# Patient Record
Sex: Male | Born: 1950 | Race: White | Hispanic: No | Marital: Married
Health system: Midwestern US, Academic
[De-identification: ages and names within clinical notes are randomized; demographics above are authoritative.]

---

## 2016-08-12 MED ORDER — ROSUVASTATIN 20 MG PO TAB
10 mg | ORAL_TABLET | ORAL | 3 refills | 90.00000 days | Status: DC
Start: 2016-08-12 — End: 2017-04-18

## 2016-12-13 ENCOUNTER — Encounter: Admit: 2016-12-13 | Discharge: 2016-12-13 | Payer: MEDICARE

## 2016-12-13 DIAGNOSIS — I10 Essential (primary) hypertension: Principal | ICD-10-CM

## 2016-12-13 MED ORDER — OLMESARTAN-HYDROCHLOROTHIAZIDE 20-12.5 MG PO TAB
1 | ORAL_TABLET | Freq: Two times a day (BID) | ORAL | 3 refills | Status: AC
Start: 2016-12-13 — End: 2016-12-15

## 2016-12-15 ENCOUNTER — Encounter: Admit: 2016-12-15 | Discharge: 2016-12-15 | Payer: MEDICARE

## 2016-12-15 DIAGNOSIS — I10 Essential (primary) hypertension: Principal | ICD-10-CM

## 2016-12-15 DIAGNOSIS — D6851 Activated protein C resistance: ICD-10-CM

## 2016-12-15 MED ORDER — OLMESARTAN-HYDROCHLOROTHIAZIDE 20-12.5 MG PO TAB
1 | ORAL_TABLET | Freq: Every morning | ORAL | 3 refills | Status: AC
Start: 2016-12-15 — End: 2018-01-05

## 2017-01-02 ENCOUNTER — Encounter: Admit: 2017-01-02 | Discharge: 2017-01-02 | Payer: MEDICARE

## 2017-01-02 NOTE — Progress Notes
Records Request    Medical records request for continuation of care:    Patient has appointment on 01/17/17   with  Dr. Marissa Nestle* .    Please fax records to [facility]  [phone]    Request records:        CT Calcium Score      Thank you,      [facility]  [facility]  [address]  [place], [place] [address]  Phone:  [phone]  Fax:  [phone]

## 2017-01-17 ENCOUNTER — Ambulatory Visit: Admit: 2017-01-17 | Discharge: 2017-01-18 | Payer: MEDICARE

## 2017-01-17 ENCOUNTER — Encounter: Admit: 2017-01-17 | Discharge: 2017-01-17 | Payer: MEDICARE

## 2017-01-17 DIAGNOSIS — I10 Essential (primary) hypertension: Principal | ICD-10-CM

## 2017-01-17 DIAGNOSIS — N289 Disorder of kidney and ureter, unspecified: ICD-10-CM

## 2017-01-17 DIAGNOSIS — E785 Hyperlipidemia, unspecified: ICD-10-CM

## 2017-01-17 DIAGNOSIS — R0600 Dyspnea, unspecified: ICD-10-CM

## 2017-01-17 DIAGNOSIS — E7800 Pure hypercholesterolemia, unspecified: ICD-10-CM

## 2017-01-17 DIAGNOSIS — D6851 Activated protein C resistance: ICD-10-CM

## 2017-01-25 ENCOUNTER — Ambulatory Visit: Admit: 2017-01-25 | Discharge: 2017-01-26 | Payer: MEDICARE

## 2017-01-25 DIAGNOSIS — R0600 Dyspnea, unspecified: ICD-10-CM

## 2017-01-25 DIAGNOSIS — E785 Hyperlipidemia, unspecified: ICD-10-CM

## 2017-01-25 DIAGNOSIS — N289 Disorder of kidney and ureter, unspecified: ICD-10-CM

## 2017-01-25 DIAGNOSIS — I10 Essential (primary) hypertension: Principal | ICD-10-CM

## 2017-02-06 ENCOUNTER — Encounter: Admit: 2017-02-06 | Discharge: 2017-02-06 | Payer: MEDICARE

## 2017-02-06 NOTE — Telephone Encounter
ADDENDUM: Comment regarding results of study ordered as result of this encounter are as follows:  The Bruce protocol exercise test that I suggested to Alejandro Jensen on September 11 has been completed.  I did the interpretation of this myself after it was routed to me upon its completion September 19.  He went 6.41minutes exceeding age corrected target heart rate.  He had upsloping ST segment shifts that were not indicative of ischemia.  His blood pressure and heart rate response were appropriate.  There is nothing to suggest that he is at high risk for adverse events based on the stress test.  There is no indication for him to pursue coronary angiography or further diagnostic testing when the ST segment are as unremarkable in this treadmill only EKG stress test.  He continues to need vigorous risk factor control with LDL maintain below 100.  I do not believe there is evidence to warrant adding Zetia 10 mg daily to his course of treatment with Crestor as long as his LDL remains a substantially below 100.    I will ask 1 of my [place] nurses to contact Alejandro Jensen about these results.      Marissa Nestle, MD 02/05/2017 1:44 PM      Date of Service: 01/17/2017

## 2017-04-18 ENCOUNTER — Encounter: Admit: 2017-04-18 | Discharge: 2017-04-18 | Payer: MEDICARE

## 2017-04-18 MED ORDER — ROSUVASTATIN 20 MG PO TAB
10 mg | ORAL_TABLET | ORAL | 3 refills | 90.00000 days | Status: AC
Start: 2017-04-18 — End: 2017-12-01

## 2017-04-18 NOTE — Telephone Encounter
Dear Dr. Andreas NewportEplee,     Alejandro Jensen (DOB [birth year]) is also followed by cardiologist, Dr. Marissa Nestleharles Porter.    Please send the following records for continuity of care:    Most recent lipid and liver profile, chemistry panel, and thyroid panel.    Please fax results to:     [facility]  [place] [facility] [place] fax: [phone]               Thank you,     Alejandro LushAndrea, RN  Please call [phone] with any questions or concerns.

## 2017-05-10 ENCOUNTER — Encounter: Admit: 2017-05-10 | Discharge: 2017-05-10 | Payer: MEDICARE

## 2017-05-10 MED ORDER — AMLODIPINE 5 MG PO TAB
ORAL_TABLET | Freq: Every day | 2 refills | Status: AC
Start: 2017-05-10 — End: 2018-02-07

## 2017-06-14 ENCOUNTER — Encounter: Admit: 2017-06-14 | Discharge: 2017-06-14 | Payer: MEDICARE

## 2017-06-14 MED ORDER — CARTIA XT 180 MG PO CP24
ORAL_CAPSULE | Freq: Every day | ORAL | 3 refills | 45.00000 days | Status: AC
Start: 2017-06-14 — End: 2018-06-20

## 2017-12-01 ENCOUNTER — Encounter: Admit: 2017-12-01 | Discharge: 2017-12-01 | Payer: MEDICARE

## 2017-12-01 MED ORDER — ROSUVASTATIN 20 MG PO TAB
10 mg | ORAL_TABLET | ORAL | 3 refills | 90.00000 days | Status: AC
Start: 2017-12-01 — End: 2018-01-16

## 2017-12-05 ENCOUNTER — Encounter: Admit: 2017-12-05 | Discharge: 2017-12-05 | Payer: MEDICARE

## 2017-12-08 ENCOUNTER — Encounter: Admit: 2017-12-08 | Discharge: 2017-12-08 | Payer: MEDICARE

## 2017-12-08 LAB — CBC
Lab: 12
Lab: 28 — ABNORMAL HIGH (ref 80–115)
Lab: 31 — ABNORMAL LOW (ref 33.0–37.0)
Lab: 54
Lab: 17
Lab: 296 — ABNORMAL LOW (ref >59)
Lab: 6.1
Lab: 6.1 — ABNORMAL HIGH (ref 4.7–6.1)
Lab: 87 — ABNORMAL HIGH (ref 0.72–1.25)

## 2017-12-08 LAB — BASIC METABOLIC PANEL: Lab: 141

## 2017-12-08 LAB — LIPID PROFILE: Lab: 137 — ABNORMAL LOW (ref 150–200)

## 2018-01-05 ENCOUNTER — Encounter: Admit: 2018-01-05 | Discharge: 2018-01-05 | Payer: MEDICARE

## 2018-01-05 DIAGNOSIS — I10 Essential (primary) hypertension: Principal | ICD-10-CM

## 2018-01-05 MED ORDER — OLMESARTAN-HYDROCHLOROTHIAZIDE 20-12.5 MG PO TAB
1 | ORAL_TABLET | Freq: Every morning | ORAL | 3 refills | Status: AC
Start: 2018-01-05 — End: 2018-11-22

## 2018-01-12 ENCOUNTER — Encounter: Admit: 2018-01-12 | Discharge: 2018-01-12 | Payer: MEDICARE

## 2018-01-12 DIAGNOSIS — N289 Disorder of kidney and ureter, unspecified: Principal | ICD-10-CM

## 2018-01-12 DIAGNOSIS — I10 Essential (primary) hypertension: ICD-10-CM

## 2018-01-15 LAB — BASIC METABOLIC PANEL
Lab: 1.7 — ABNORMAL HIGH (ref 0.72–1.25)
Lab: 105
Lab: 117 — ABNORMAL HIGH (ref 80–115)
Lab: 14 MMOL/L (ref 137–147)
Lab: 140
Lab: 19
Lab: 25
Lab: 4.1
Lab: 9.2

## 2018-01-16 ENCOUNTER — Ambulatory Visit: Admit: 2018-01-16 | Discharge: 2018-01-17 | Payer: MEDICARE

## 2018-01-16 ENCOUNTER — Encounter: Admit: 2018-01-16 | Discharge: 2018-01-16 | Payer: MEDICARE

## 2018-01-16 DIAGNOSIS — I10 Essential (primary) hypertension: ICD-10-CM

## 2018-01-16 DIAGNOSIS — N289 Disorder of kidney and ureter, unspecified: Principal | ICD-10-CM

## 2018-01-16 DIAGNOSIS — E7800 Pure hypercholesterolemia, unspecified: ICD-10-CM

## 2018-01-16 DIAGNOSIS — D6851 Activated protein C resistance: ICD-10-CM

## 2018-01-16 DIAGNOSIS — I700 Atherosclerosis of aorta: Principal | ICD-10-CM

## 2018-01-16 MED ORDER — ROSUVASTATIN 10 MG PO TAB
10 mg | ORAL_TABLET | Freq: Every day | ORAL | 3 refills | 90.00000 days | Status: AC
Start: 2018-01-16 — End: 2018-03-19

## 2018-02-07 ENCOUNTER — Encounter: Admit: 2018-02-07 | Discharge: 2018-02-07 | Payer: MEDICARE

## 2018-02-07 MED ORDER — AMLODIPINE 5 MG PO TAB
ORAL_TABLET | Freq: Every day | 2 refills | Status: AC
Start: 2018-02-07 — End: 2018-10-29

## 2018-03-17 ENCOUNTER — Encounter: Admit: 2018-03-17 | Discharge: 2018-03-17 | Payer: MEDICARE

## 2018-03-19 ENCOUNTER — Encounter: Admit: 2018-03-19 | Discharge: 2018-03-19 | Payer: MEDICARE

## 2018-03-19 MED ORDER — ROSUVASTATIN 10 MG PO TAB
10 mg | ORAL_TABLET | Freq: Every day | ORAL | 3 refills | 90.00000 days | Status: AC
Start: 2018-03-19 — End: 2019-02-11

## 2018-03-19 MED ORDER — ROSUVASTATIN 10 MG PO TAB
ORAL_TABLET | ORAL | 3 refills | 90.00000 days | Status: AC
Start: 2018-03-19 — End: 2018-03-19

## 2018-04-30 ENCOUNTER — Encounter: Admit: 2018-04-30 | Discharge: 2018-04-30 | Payer: MEDICARE

## 2018-04-30 DIAGNOSIS — E7800 Pure hypercholesterolemia, unspecified: Principal | ICD-10-CM

## 2018-06-04 ENCOUNTER — Encounter: Admit: 2018-06-04 | Discharge: 2018-06-04 | Payer: MEDICARE

## 2018-06-05 ENCOUNTER — Encounter: Admit: 2018-06-05 | Discharge: 2018-06-05 | Payer: MEDICARE

## 2018-06-05 ENCOUNTER — Ambulatory Visit: Admit: 2018-06-05 | Discharge: 2018-06-05 | Payer: MEDICARE

## 2018-06-05 DIAGNOSIS — D6851 Activated protein C resistance: Secondary | ICD-10-CM

## 2018-06-05 DIAGNOSIS — I10 Essential (primary) hypertension: Secondary | ICD-10-CM

## 2018-06-05 DIAGNOSIS — N132 Hydronephrosis with renal and ureteral calculous obstruction: Secondary | ICD-10-CM

## 2018-06-05 DIAGNOSIS — E7800 Pure hypercholesterolemia, unspecified: Secondary | ICD-10-CM

## 2018-06-05 DIAGNOSIS — N183 Chronic kidney disease, stage 3 (moderate): Secondary | ICD-10-CM

## 2018-06-05 DIAGNOSIS — N289 Disorder of kidney and ureter, unspecified: Secondary | ICD-10-CM

## 2018-06-05 DIAGNOSIS — I700 Atherosclerosis of aorta: Secondary | ICD-10-CM

## 2018-06-05 LAB — BASIC METABOLIC PANEL
Lab: 1.8 mg/dL — ABNORMAL HIGH (ref 0.4–1.24)
Lab: 139 MMOL/L (ref 137–147)
Lab: 25 mg/dL (ref 7–25)
Lab: 3.6 MMOL/L (ref 3.5–5.1)
Lab: 36 mL/min — ABNORMAL LOW (ref >60)

## 2018-06-05 LAB — MICROALB/CR RATIO-URINE RANDOM
Lab: 11 ug/mg (ref <30)
Lab: 165 mg/dL (ref 7–40)
Lab: 19 ug/mL — ABNORMAL HIGH (ref <19)

## 2018-06-05 LAB — 25-OH VITAMIN D (D2 + D3): Lab: 51 ng/mL (ref 30–80)

## 2018-06-05 LAB — CBC
Lab: 13 % (ref 11–15)
Lab: 16 g/dL (ref 13.5–16.5)
Lab: 29 pg (ref 26–34)
Lab: 47 % (ref 40–50)
Lab: 5 10*3/uL (ref 4.5–11.0)
Lab: 5.5 M/UL — ABNORMAL HIGH (ref 4.4–5.5)
Lab: 8.1 FL (ref 7–11)

## 2018-06-05 LAB — LIPID PROFILE
Lab: 191 mg/dL — ABNORMAL HIGH (ref <150)
Lab: 26 mg/dL — ABNORMAL LOW (ref >40)
Lab: 39 mg/dL (ref <100)
Lab: 88 mg/dL (ref <200)

## 2018-06-05 LAB — PROTEIN/CR RATIO,UR RAN: Lab: 0.2 (ref 3–12)

## 2018-06-05 LAB — PHOSPHORUS: Lab: 3.4 mg/dL — ABNORMAL LOW (ref >60)

## 2018-06-05 LAB — URIC ACID: Lab: 10 mg/dL — ABNORMAL HIGH (ref 4.0–8.0)

## 2018-06-05 LAB — MAGNESIUM: Lab: 2.2 mg/dL (ref 1.6–2.6)

## 2018-06-05 LAB — CREATINE KINASE-CPK: Lab: 110 U/L (ref 35–232)

## 2018-06-20 ENCOUNTER — Encounter: Admit: 2018-06-20 | Discharge: 2018-06-20 | Payer: MEDICARE

## 2018-06-20 MED ORDER — CARTIA XT 180 MG PO CP24
ORAL_CAPSULE | ORAL | 3 refills | 45.00000 days | Status: AC
Start: 2018-06-20 — End: 2019-06-21

## 2018-07-03 ENCOUNTER — Ambulatory Visit: Admit: 2018-07-03 | Discharge: 2018-07-03 | Payer: MEDICARE

## 2018-07-03 ENCOUNTER — Encounter: Admit: 2018-07-03 | Discharge: 2018-07-03 | Payer: MEDICARE

## 2018-07-03 DIAGNOSIS — E790 Hyperuricemia without signs of inflammatory arthritis and tophaceous disease: ICD-10-CM

## 2018-07-03 DIAGNOSIS — E7800 Pure hypercholesterolemia, unspecified: ICD-10-CM

## 2018-07-03 DIAGNOSIS — I10 Essential (primary) hypertension: ICD-10-CM

## 2018-07-03 DIAGNOSIS — D6851 Activated protein C resistance: ICD-10-CM

## 2018-07-03 DIAGNOSIS — N183 Chronic kidney disease, stage 3 (moderate): ICD-10-CM

## 2018-07-03 NOTE — Progress Notes
When he checks his blood pressures in the 120/70 range.  Today's range in the 130/76 is not unexpected.  Not having any readings in the 1 40-1 50 range.    In January 2018 and I had him undergo a coronary calcium score scan at [facility] which gave a  total Agatston score of 454.4 which put him in a higher risk category compared to lower scores    He had a treadmill exercise test on January 25, 2017 which showed the following: Bruce protocol exercise test  ended at 6 min 45 secs, reaching 88% of age-predicted maximum heart rate..  He had upsloping ST segment shifts that were not indicative of ischemia.  His blood pressure and heart rate response were appropriate.     He had a flulike syndrome couple weeks ago.  He did not go in for respiratory virus swab he just stayed home and recovered uneventfully.           Vitals:    07/03/18 1113 07/03/18 1116   BP: 132/78 134/76   BP Source: Arm, Right Upper Arm, Left Upper   Pulse: 81    SpO2: 98%    Weight: 97.1 kg (214 lb)    Height: 1.753 m (5' 9)    PainSc: Zero      Body mass index is 31.6 kg/m???.     Past Medical History  Patient Active Problem List    Diagnosis Date Noted   ??? Chronic kidney disease, stage 3 (HCC) 06/05/2018   ??? Renal insufficiency 08/14/2012     Associated with hydronephrosis and angiotensin receptor blocker therapy     ??? Hypothyroidism 07/31/2012     07/30/12 TSH 4.42 on 125 mcg synthroid,(Nl < 3.74) increase to 150 Dr. Andreas Newport.      ??? Hydronephrosis with right ureteral calculus 03/21/2012     11/13 Stone removed d/t persistent pain [facility]  Jan 2014 Creat 1.8, 07/30/12 Creat 1.6 BUN 17, K 4.3 on Losartan 100/day w/ blood pressure 152/82   1/15 OV Dr. Larwance Rote, most recent Creat 1.8     ??? Essential hypertension      a. Valsartan better control than accupril  b. 2008 Echo EF 60% Septum 1.8 cm, post wall 1.4 indicating LVH as end organ damage from LVH  c. 2/09 Echo EF 60% Septum and Post wall 1.4 cm D. 07/31/12 BP 152/82 on Losartan 100   E. 06/18/13 BP 110/80 MAC office on Amlodipine 5, Dilt 360, Valsartan 160 andHCTZ 12.5 with no edema       ??? Hypercholesterolemia       a. Niacin from 2006(?)    b. 04/28/06 total 169 trig 219 HDL 39 LDLd 85 Niacin 500 BID    c. 07/17/08 Total 97 trig 79 HDL 40 LDL 42 Crestor 20 niacin 500 BID  d. 02/17/15 DC niacin 500 d/t lack of outcomes benefits from Niacin.        ??? Factor V Leiden (HCC)      a. Folate Rx by [facility], DC'd 3/08             Review of Systems   Constitution: Negative.   HENT: Negative.    Eyes: Negative.    Cardiovascular: Negative.    Respiratory: Negative.    Endocrine: Negative.    Hematologic/Lymphatic: Negative.    Skin: Negative.    Musculoskeletal: Negative.    Gastrointestinal: Positive for abdominal pain.   Genitourinary: Negative.    Neurological: Negative.  Psychiatric/Behavioral: Negative.    Allergic/Immunologic: Negative.    14 organ system review noted. It is negative except as reported in current narrative or above in the ROS section. This is a patient centered review of systems that was stated by the patient in his terms prior to my personal problem oriented interview with the patient     Physical Exam  General: Patient in no distress, looks generally healthy. Skin warm and dry, non icteric,     Mucous membranes moist  Pupils equal and round  Carotids: no bruits    Thyroid not enlarged.  Neck veins: CVP <6 normal, no V wave, no HJR     Respiratoryt: Breathing comfortably. Lungs clear to percussion & auscultation .No rales, rhonchi or wheezing   Cardiac: Regular rhythm. LV impulse not palpable. Normal S1 & S2, Fourth heart sound, no rub or S3. No murmur  Abdomen soft, non-tender, no masses or bruits, No hepatic or aortic enlargement. + bowel sounds.   Femoral arteries: Good pulses, no bruits.  Legs/feet: Normal PT pulses, No edema,  Motor: Normal muscle strength.      Cognitive: Good insight. No depression    Cardiovascular Studies Today's 12 lead EKG: sinus rhythm, rate 68.     Her recent lipid profile which I think was drawn when he saw Dr. Melida Quitter at [facility] shows HDL at 26 and LDL slightly under 40 at 39 with a substantial drop of 50% since January 2018.   05/16/2016 09/12/2017  06/05/2018    Cholesterol 142 (L) 137 (L) 88   Triglycerides 171 124 191 (H)   HDL 39 36 26 (L)   LDL 78 72 39     Problems Addressed Today  Encounter Diagnoses   Name Primary?   ??? Hypercholesterolemia Yes   ??? Essential hypertension    ??? Hydronephrosis with right ureteral calculus        Assessment and Plan     Mr. Alejandro Jensen had a benign treadmill exercise test less than 18 months ago.  He has sustained satisfactory exercise program predominantly using his weights been doing some walking.  I do not think he needs further stress testing with without onset of symptoms.    His controlled hypertension is difficult to assess.  He is not had an echocardiogram in years.  I think getting an echo would be prudent because it would give [place] an idea about LV hypertrophy and thus an indication of whether his usual blood pressure control over the past several months has been satisfactory.  Hopefully he will have wall thickness 1.0 cm  or less but that remains to be seen.  Would not necessarily make a major augmentation of his blood pressure readings if he does have concentric LV hypertrophy but I would consider making some augmentation of treatment.  Dr. Melida Quitter noted when she saw him in January that the hydrochlorothiazide he is taking could contribute to hyperuricemia and worsened renal function related to the uric acid crystals.    I think it is important at this time to get the echo to see if he has LV hypertrophy.  He does not need a Doppler just an echo as there is no suggestion of significant valve disease or pulmonary hypertension.  I like him to get this done in our office lab where I can review it.    Note his uric acid was elevated at 10.6 when it was checked when he saw Dr. Melida Quitter.  I think when we have profile of his wall  thickness is an indicator of adequacy of high blood pressure control and knowledge about his elevated uric acid Dr. Melida Quitter and I may be able to drive program directed at optimal blood pressure control and minimization of risk of uric acid related nephrotoxicity.    Marland Kitcheniiii   Patient Instructions   Your blood pressure control looks good today in the office barely above 130.  I mention to you that when you saw Dr. Melida Quitter you had a slightly higher blood pressure on the right side and today it slightly higher on the left side.  That means there is no obstruction between the arms and that you do not need to be evaluated for any kind of blood vessel abnormality blocking blood flow to one arm or the other was the kidneys.    Like you get an echocardiogram to see if you have thick walls of the heart which would indicate your blood pressure control is not optimal.    Was here uric acid elevation at 10.6 detected when you had lab work with Dr. Melida Quitter in January we need to consider whether revision of your blood pressure medications needs to include reduction or elimination of the hydrochlorothiazide which can raise the uric acid.  Is more complicated for [place] to change her blood pressure meds if we see you have LV hypertrophy which means we need to be more vigorous with your blood pressure controlled and we are now.    Your stress test looked good a year and a half ago.  I would like you to get your exercise time up to 3 hours a week with more stamina exercise as well as the weights.  As long as you are not having chest pain or new shortness of breath I do not think you will need a stress test in the near future.    Your cholesterol control looks good.  No change there.    .  I will do my best to  reveiew your 2 D echo only, (no Doppler) for wall thickness soon after it is done but if you do not hear anything in a couple of weeks call back in to [place] or Misty Stanley to remind me to take a look    It's good to see you today.   I think it would be good for you to return for a recheck in six months. I can see you sooner if needed.   Call in if you have problems or questions.   Marissa Nestle, MD                     Current Medications (including today's revisions)  ??? amLODIPine (NORVASC) 5 mg tablet TAKE 1 TABLET BY MOUTH EVERY DAY   ??? Aspirin 81 mg Tab Take 1 tablet by mouth daily.   ??? CARTIA XT 180 mg capsule 2C PO QD   ??? Coenzyme Q10 (CO Q-10) 10 mg cap Take  by mouth.   ??? ERGOCALCIFEROL (VITAMIN D2) (VITAMIN D PO) Take 2,000 Units by mouth daily.   ??? levothyroxine (SYNTHROID) 125 mcg tablet Take 125 mcg by mouth daily.   ??? olmesartan-hydrochlorothiazide (BENICAR HCT) 20-12.5 mg tablet Take one tablet by mouth every morning.   ??? rosuvastatin (CRESTOR) 10 mg tablet Take one tablet by mouth daily.   ??? traMADoL (ULTRAM) 50 mg tablet Take 50 mg by mouth every 6 hours as needed for Pain.

## 2018-07-11 ENCOUNTER — Ambulatory Visit: Admit: 2018-07-11 | Discharge: 2018-07-12 | Payer: MEDICARE

## 2018-07-11 DIAGNOSIS — I10 Essential (primary) hypertension: ICD-10-CM

## 2018-07-11 DIAGNOSIS — E7800 Pure hypercholesterolemia, unspecified: Principal | ICD-10-CM

## 2018-07-11 MED ORDER — PERFLUTREN LIPID MICROSPHERES 1.1 MG/ML IV SUSP
1-20 mL | Freq: Once | INTRAVENOUS | 0 refills | Status: CP | PRN
Start: 2018-07-11 — End: ?

## 2018-10-03 ENCOUNTER — Encounter: Admit: 2018-10-03 | Discharge: 2018-10-03 | Payer: MEDICARE

## 2018-10-03 DIAGNOSIS — N200 Calculus of kidney: Principal | ICD-10-CM

## 2018-10-29 ENCOUNTER — Encounter: Admit: 2018-10-29 | Discharge: 2018-10-29

## 2018-10-29 MED ORDER — AMLODIPINE 5 MG PO TAB
ORAL_TABLET | Freq: Every day | 1 refills | Status: DC
Start: 2018-10-29 — End: 2019-01-25

## 2018-11-21 ENCOUNTER — Encounter: Admit: 2018-11-21 | Discharge: 2018-11-21

## 2018-11-21 DIAGNOSIS — I10 Essential (primary) hypertension: Secondary | ICD-10-CM

## 2018-11-22 MED ORDER — OLMESARTAN-HYDROCHLOROTHIAZIDE 20-12.5 MG PO TAB
ORAL_TABLET | Freq: Every morning | ORAL | 3 refills | Status: DC
Start: 2018-11-22 — End: 2018-12-31

## 2018-12-31 ENCOUNTER — Encounter: Admit: 2018-12-31 | Discharge: 2018-12-31

## 2018-12-31 DIAGNOSIS — N183 Chronic kidney disease, stage 3 (moderate): Secondary | ICD-10-CM

## 2018-12-31 DIAGNOSIS — D508 Other iron deficiency anemias: Secondary | ICD-10-CM

## 2018-12-31 DIAGNOSIS — E790 Hyperuricemia without signs of inflammatory arthritis and tophaceous disease: Secondary | ICD-10-CM

## 2018-12-31 DIAGNOSIS — I10 Essential (primary) hypertension: Secondary | ICD-10-CM

## 2018-12-31 MED ORDER — OLMESARTAN-HYDROCHLOROTHIAZIDE 20-12.5 MG PO TAB
ORAL_TABLET | Freq: Every morning | ORAL | 3 refills | Status: DC
Start: 2018-12-31 — End: 2019-11-14

## 2019-01-01 ENCOUNTER — Encounter: Admit: 2019-01-01 | Discharge: 2019-01-01

## 2019-01-01 ENCOUNTER — Ambulatory Visit: Admit: 2019-01-01 | Discharge: 2019-01-02

## 2019-01-01 DIAGNOSIS — E7800 Pure hypercholesterolemia, unspecified: Secondary | ICD-10-CM

## 2019-01-01 DIAGNOSIS — D6851 Activated protein C resistance: Secondary | ICD-10-CM

## 2019-01-01 DIAGNOSIS — I10 Essential (primary) hypertension: Secondary | ICD-10-CM

## 2019-01-01 DIAGNOSIS — N183 Chronic kidney disease, stage 3 (moderate): Principal | ICD-10-CM

## 2019-01-01 NOTE — Research Notes
Study Title: EMPA-kidney  HSC/IRB Number: [id number]  Consent Version Date: 08/03/2018  Consent Type:Screen    Clinical trial participation and research nature of the trial were discussed with subject during this visit. Subject was alert and oriented during consent discussion. Subject was informed that clinical trial is voluntary and participant may withdraw consent at any time for any reason by notifying study team. Study purpose, procedures, tests, samples to be obtained, potential side effects, benefits, foreseeable risks and duration of study were discussed. HIPAA information, compensation were discussed per consent form. Subject verbalized understanding.     Subject was given time to review the consent form and to discuss participation in this study. Questions asked were answered to participants satisfaction and subject voiced desire to sign consent today. Subject signed consent without coercion and undue influence. A copy of the signed consent was given to the subject and emailed [facility] [employer] [facility] ([employer]) for scanning into the subjects medical record in Epic O2. Contact information for the study team was given to the subject.     No research specific procedures took place prior to consenting.    Please contact Dr Reem Mustafa M.D. [phone] or   Research Nurse Study Coordinator  [phone]

## 2019-01-01 NOTE — Patient Instructions
Call urologist about BPH meds.

## 2019-01-01 NOTE — Progress Notes
Date of Service: 01/01/2019    Subjective:             Alejandro Jensen is a 68 y.o. male with HTN, CKD. Last (and first) seen by me 06/05/18.     History of Present Illness  No hosp, sig illness since I last saw pt.     Homes BPs 120-130/70-80    Sees outside [facility] annually for yrs.     Excerpt from my initial note 06/05/18:  Pt has known of elevated Cr X many years. Pt doesn't know etiol. Thinks it might be kidney stones. Was seen by neph [facility] once. Has not been told of prot, hemat.  No hx prolonged, heavy use of NSAID or aceta???  Has passed 2 kidney. First time pt was in [place] 25 yrs ago. Was seen in ER. Next stone 10-15 yrs later. Was seen by urol at that time. Had stent. No stones/gravel since then. Pt doesn't think he had 24 urine, etc. ???  Had n/vom/d sev days ago, lasted 3-4 days. Completely resolved X 1-2 days???  HTN X 30 yr???  Review of O2:  07/04/17 echo--mod LVH???  Cr mid 1.0s since Feb 2012 at least; Cr-1.7-1.8 since 2014.  Cr-1.7 on 01/15/18   Electrolytes unremarkable???  No labs in CE.     Review of Systems   Constitutional: Negative.    HENT: Negative.    Eyes: Negative.    Respiratory: Negative.    Cardiovascular: Negative.    Gastrointestinal: Negative.    Endocrine: Negative.    Genitourinary: Negative.         Noct X 1-2, no change X months, was always once before. Urinates sitting d/t hesitancy; some difficulty emptying bladder; no dribbling. No recent stones.   Musculoskeletal: Negative.    Skin: Negative.    Neurological: Negative.    Hematological: Negative.    Psychiatric/Behavioral: Negative.        Medical History:   Diagnosis Date   ??? Factor V Leiden (HCC) 08/20/2009   ??? Hypercholesterolemia 08/20/2009   ??? Hypertension 08/20/2009         Took meds except rosuva this am.   Objective:         ??? amLODIPine (NORVASC) 5 mg tablet TAKE 1 TABLET BY MOUTH EVERY DAY   ??? Aspirin 81 mg Tab Take 1 tablet by mouth daily.   ??? CARTIA XT 180 mg capsule 2C PO QD ??? Coenzyme Q10 (CO Q-10) 10 mg cap Take  by mouth.   ??? ERGOCALCIFEROL (VITAMIN D2) (VITAMIN D PO) Take 2,000 Units by mouth daily.   ??? levothyroxine (SYNTHROID) 125 mcg tablet Take 125 mcg by mouth daily.   ??? olmesartan-hydrochlorothiazide (BENICAR HCT) 20-12.5 mg tablet TAKE 1 TABLET BY MOUTH EVERY MORNING   ??? rosuvastatin (CRESTOR) 10 mg tablet Take one tablet by mouth daily.   ??? traMADoL (ULTRAM) 50 mg tablet Take 50 mg by mouth every 6 hours as needed for Pain.     Vitals:    01/01/19 0954 01/01/19 0959   BP: 117/64 100/71   BP Source: Arm, Right Upper Arm, Right Upper   Patient Position: Sitting Standing   Pulse: 62 76   Resp: 18    Temp: 36.7 ???C (98 ???F)    TempSrc: Oral    SpO2: 99%    Weight: 94.1 kg (207 lb 8 oz)    Height: 175.3 cm (69.02)    PainSc: Zero      Body mass index is  30.63 kg/m???.     Physical Exam  Vitals signs and nursing note reviewed.   Constitutional:       General: He is not in acute distress.     Appearance: Normal appearance. He is obese. He is not ill-appearing, toxic-appearing or diaphoretic.      Comments: Electronic, reg (previous values taken with large cuff)  Rt sitting 127/80, 75  Left sitting 133/76, 71   HENT:      Head: Normocephalic and atraumatic.      Nose: No congestion or rhinorrhea.      Mouth/Throat:      Mouth: Mucous membranes are moist.   Eyes:      General: No scleral icterus.        Right eye: No discharge.         Left eye: No discharge.      Conjunctiva/sclera: Conjunctivae normal.   Neck:      Musculoskeletal: Neck supple. No neck rigidity or muscular tenderness.   Cardiovascular:      Rate and Rhythm: Normal rate and regular rhythm.      Heart sounds: Normal heart sounds. No murmur. No friction rub. No gallop.    Pulmonary:      Effort: Pulmonary effort is normal. No respiratory distress.      Breath sounds: Normal breath sounds. No stridor. No wheezing, rhonchi or rales.   Abdominal:      General: Bowel sounds are normal. There is no distension. Palpations: Abdomen is soft. There is no mass.      Tenderness: There is no abdominal tenderness. There is no guarding or rebound.   Musculoskeletal:         General: No swelling, tenderness, deformity or signs of injury.      Right lower leg: No edema.      Left lower leg: No edema.   Lymphadenopathy:      Cervical: No cervical adenopathy.   Skin:     General: Skin is warm and dry.      Coloration: Skin is not jaundiced or pale.      Findings: No bruising, erythema, lesion or rash.   Neurological:      Mental Status: He is alert. Mental status is at baseline.      Motor: No weakness.      Coordination: Coordination normal.      Comments: Got on/off table without diff   Psychiatric:         Mood and Affect: Mood normal.         Behavior: Behavior normal.         Thought Content: Thought content normal.         Judgment: Judgment normal.         No labs in O2 or CE since Jan 2020     Assessment and Plan:  CKD3--pt has had elevated Cr for many yrs. Cr is very little changed for the past several years. Cardiol note of 2008 describes RUS that did not show evidence of RAS, though these results not available to me. There is also mention in cardiol clinic notes that Cr did not improve sig even when ARB held. I suspect his CKD is d/t HTN. His sediment was bland in Jan 2020; no bld, prot on dipstick.                --importance of good control of BP, BG, attaining/maintaining healthy wt, avoidance of nephrotoxins d/w pt at length               --  get outside neph records  ???  HTN--suspect essential. BP differs b/w arms in past, though not today. It will be important to further document if this occurs chronically. Will need to adjust meds based on higher values, if this finding continues. May need vasc surg eval if he cont to exhibit this discrepancy. BP appears to be reasonably well-controlled on current meds. I favor use of RAAS blockade in this pt.                --pt advised to have BP checked on both [bare] arms with properly fitting cuff for the next several MD appts  ???  ARB--pt cont to take all meds in setting of n/vom/d for sev days. I advised him to hold RAAS blocking agent in setting of n/vom/d, poor po intake, notify PMD and/or cardiol immed about holding, restarting once po intake resumes.   ???  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 I do not have access to previous values, though this was undoubtedly checked in past when he had kidney stone.                --follow               --could consider replacing HTCZ, rechecking uric acid to see what if any effect this med was having  ???  Nephrolithiasis--none recently.                --get outside labs, notes  ???  Possible dx/px/tx options d/w pt at length; all questions answered. RTC 6 mo

## 2019-01-02 DIAGNOSIS — N132 Hydronephrosis with renal and ureteral calculous obstruction: Secondary | ICD-10-CM

## 2019-01-02 DIAGNOSIS — I10 Essential (primary) hypertension: Secondary | ICD-10-CM

## 2019-01-25 ENCOUNTER — Encounter: Admit: 2019-01-25 | Discharge: 2019-01-25 | Payer: MEDICARE

## 2019-01-25 MED ORDER — AMLODIPINE 5 MG PO TAB
ORAL_TABLET | Freq: Every day | 3 refills | Status: AC
Start: 2019-01-25 — End: ?

## 2019-02-11 MED ORDER — ROSUVASTATIN 10 MG PO TAB
ORAL_TABLET | Freq: Every day | ORAL | 1 refills | 90.00000 days | Status: DC
Start: 2019-02-11 — End: 2019-08-13

## 2019-03-04 ENCOUNTER — Encounter: Admit: 2019-03-04 | Discharge: 2019-03-04 | Payer: MEDICARE

## 2019-03-05 ENCOUNTER — Encounter: Admit: 2019-03-05 | Discharge: 2019-03-05 | Payer: MEDICARE

## 2019-03-05 DIAGNOSIS — I10 Essential (primary) hypertension: Secondary | ICD-10-CM

## 2019-03-05 DIAGNOSIS — E7800 Pure hypercholesterolemia, unspecified: Secondary | ICD-10-CM

## 2019-03-05 DIAGNOSIS — D6851 Activated protein C resistance: Secondary | ICD-10-CM

## 2019-03-05 NOTE — Patient Instructions
Overall I am very pleased with your situation.  You are in one of the most forward looking trials in protecting kidney function that is available with the Empakidney trial.  That drug is also protective of patients with heart disease.  Although we have never seen active heart disease and you really think this drug is a breakthrough for people with heart disease or heart disease with kidney disease.  The trial you are in is important and will have a lot of impact 1 where another when it is published.    Outside the trial we need to keep track of your blood work.  When I have you check a BMP and magnesium today to make sure your basic electrolytes look good and your kidney function is about where we think it is.    Your blood pressure looks good.  Your right and left arm blood pressures are within 2 points of each other which indicates that you do not have any physical obstruction and the blood vessels between the the arms.    I will think we need to do any other work right now.  I do not think you need a stress test.  Stay on your meds.  Losing 20 pounds would be very reasonable way to lose a little weight related blood pressure.  Most men less than 6 feet 4 are healthier if their weight is under 200 rather than over.    See me in May or June.   Call in if you have problems or questions.   Lenoard Aden, MD

## 2019-06-21 ENCOUNTER — Encounter: Admit: 2019-06-21 | Discharge: 2019-06-21 | Payer: MEDICARE

## 2019-06-21 MED ORDER — DILTIAZEM HCL 180 MG PO CP24
360 mg | ORAL_CAPSULE | Freq: Every day | ORAL | 3 refills | 45.00000 days | Status: AC
Start: 2019-06-21 — End: ?

## 2019-08-13 ENCOUNTER — Encounter: Admit: 2019-08-13 | Discharge: 2019-08-13 | Payer: MEDICARE

## 2019-08-13 MED ORDER — ROSUVASTATIN 10 MG PO TAB
ORAL_TABLET | Freq: Every day | ORAL | 3 refills | 90.00000 days | Status: AC
Start: 2019-08-13 — End: ?

## 2019-08-27 NOTE — Progress Notes
pt returned call. Alejandro Jensen had already spoke to somebody from the office earlier (no documentation). I told him I would make sure orders were sent to [facility] lab. Orders faxed to [phone]

## 2019-10-22 ENCOUNTER — Encounter: Admit: 2019-10-22 | Discharge: 2019-10-22 | Payer: MEDICARE

## 2019-10-22 ENCOUNTER — Ambulatory Visit: Admit: 2019-10-22 | Discharge: 2019-10-23 | Payer: MEDICARE

## 2019-10-22 DIAGNOSIS — N183 Stage 3 chronic kidney disease, unspecified whether stage 3a or 3b CKD (HCC): Principal | ICD-10-CM

## 2019-10-22 DIAGNOSIS — E7800 Pure hypercholesterolemia, unspecified: Secondary | ICD-10-CM

## 2019-10-22 DIAGNOSIS — D6851 Activated protein C resistance: Secondary | ICD-10-CM

## 2019-10-22 DIAGNOSIS — I10 Essential (primary) hypertension: Secondary | ICD-10-CM

## 2019-11-14 ENCOUNTER — Encounter: Admit: 2019-11-14 | Discharge: 2019-11-14 | Payer: MEDICARE

## 2019-11-14 DIAGNOSIS — I10 Essential (primary) hypertension: Secondary | ICD-10-CM

## 2019-11-14 MED ORDER — OLMESARTAN-HYDROCHLOROTHIAZIDE 20-12.5 MG PO TAB
ORAL_TABLET | Freq: Every morning | ORAL | 3 refills | Status: AC
Start: 2019-11-14 — End: ?

## 2020-02-20 ENCOUNTER — Encounter: Admit: 2020-02-20 | Discharge: 2020-02-20 | Payer: MEDICARE

## 2020-02-20 MED ORDER — AMLODIPINE 5 MG PO TAB
ORAL_TABLET | Freq: Every day | 3 refills | Status: AC
Start: 2020-02-20 — End: ?

## 2020-05-06 ENCOUNTER — Encounter: Admit: 2020-05-06 | Discharge: 2020-05-06 | Payer: MEDICARE

## 2020-05-06 MED ORDER — DILTIAZEM HCL 180 MG PO CP24
ORAL_CAPSULE | Freq: Every day | 3 refills | 45.00000 days | Status: AC
Start: 2020-05-06 — End: ?

## 2020-08-17 ENCOUNTER — Encounter: Admit: 2020-08-17 | Discharge: 2020-08-17 | Payer: MEDICARE

## 2020-08-17 MED ORDER — ROSUVASTATIN 10 MG PO TAB
ORAL_TABLET | Freq: Every day | ORAL | 3 refills | 90.00000 days | Status: AC
Start: 2020-08-17 — End: ?

## 2020-10-14 ENCOUNTER — Encounter: Admit: 2020-10-14 | Discharge: 2020-10-14 | Payer: MEDICARE

## 2020-10-20 ENCOUNTER — Encounter: Admit: 2020-10-20 | Discharge: 2020-10-20 | Payer: MEDICARE

## 2020-10-20 ENCOUNTER — Ambulatory Visit: Admit: 2020-10-20 | Discharge: 2020-10-21 | Payer: MEDICARE

## 2020-10-20 DIAGNOSIS — Z006 Encounter for examination for normal comparison and control in clinical research program: Secondary | ICD-10-CM

## 2020-10-20 DIAGNOSIS — D6851 Activated protein C resistance: Secondary | ICD-10-CM

## 2020-10-20 DIAGNOSIS — E790 Hyperuricemia without signs of inflammatory arthritis and tophaceous disease: Secondary | ICD-10-CM

## 2020-10-20 DIAGNOSIS — I10 Essential (primary) hypertension: Secondary | ICD-10-CM

## 2020-10-20 DIAGNOSIS — E7800 Pure hypercholesterolemia, unspecified: Secondary | ICD-10-CM

## 2020-10-20 LAB — MICROALB/CR RATIO-URINE RANDOM
MICROALBUMIN, RAN: 61 ug/mL
MICROALBUMIN/CR RATIO URINE: 60 ug/mg — ABNORMAL HIGH (ref <30)
UR CREATININE, RAN: 101 mg/dL — ABNORMAL HIGH (ref <0.15)

## 2020-10-20 LAB — PROTEIN/CR RATIO,UR RAN: UR TOTAL PROTEIN,RAN: 21 mg/dL

## 2020-10-20 NOTE — Patient Instructions
[place] [phone]

## 2020-10-21 DIAGNOSIS — N1832 Chronic kidney disease, stage 3b (HCC): Secondary | ICD-10-CM

## 2020-10-21 DIAGNOSIS — N183 Chronic kidney disease, stage 3 unspecified (HCC): Secondary | ICD-10-CM

## 2020-10-23 IMAGING — CR PELVIS
1 series · 1 of 1 positions shown · non-contrast
Comparison: none

[l-spine ap]
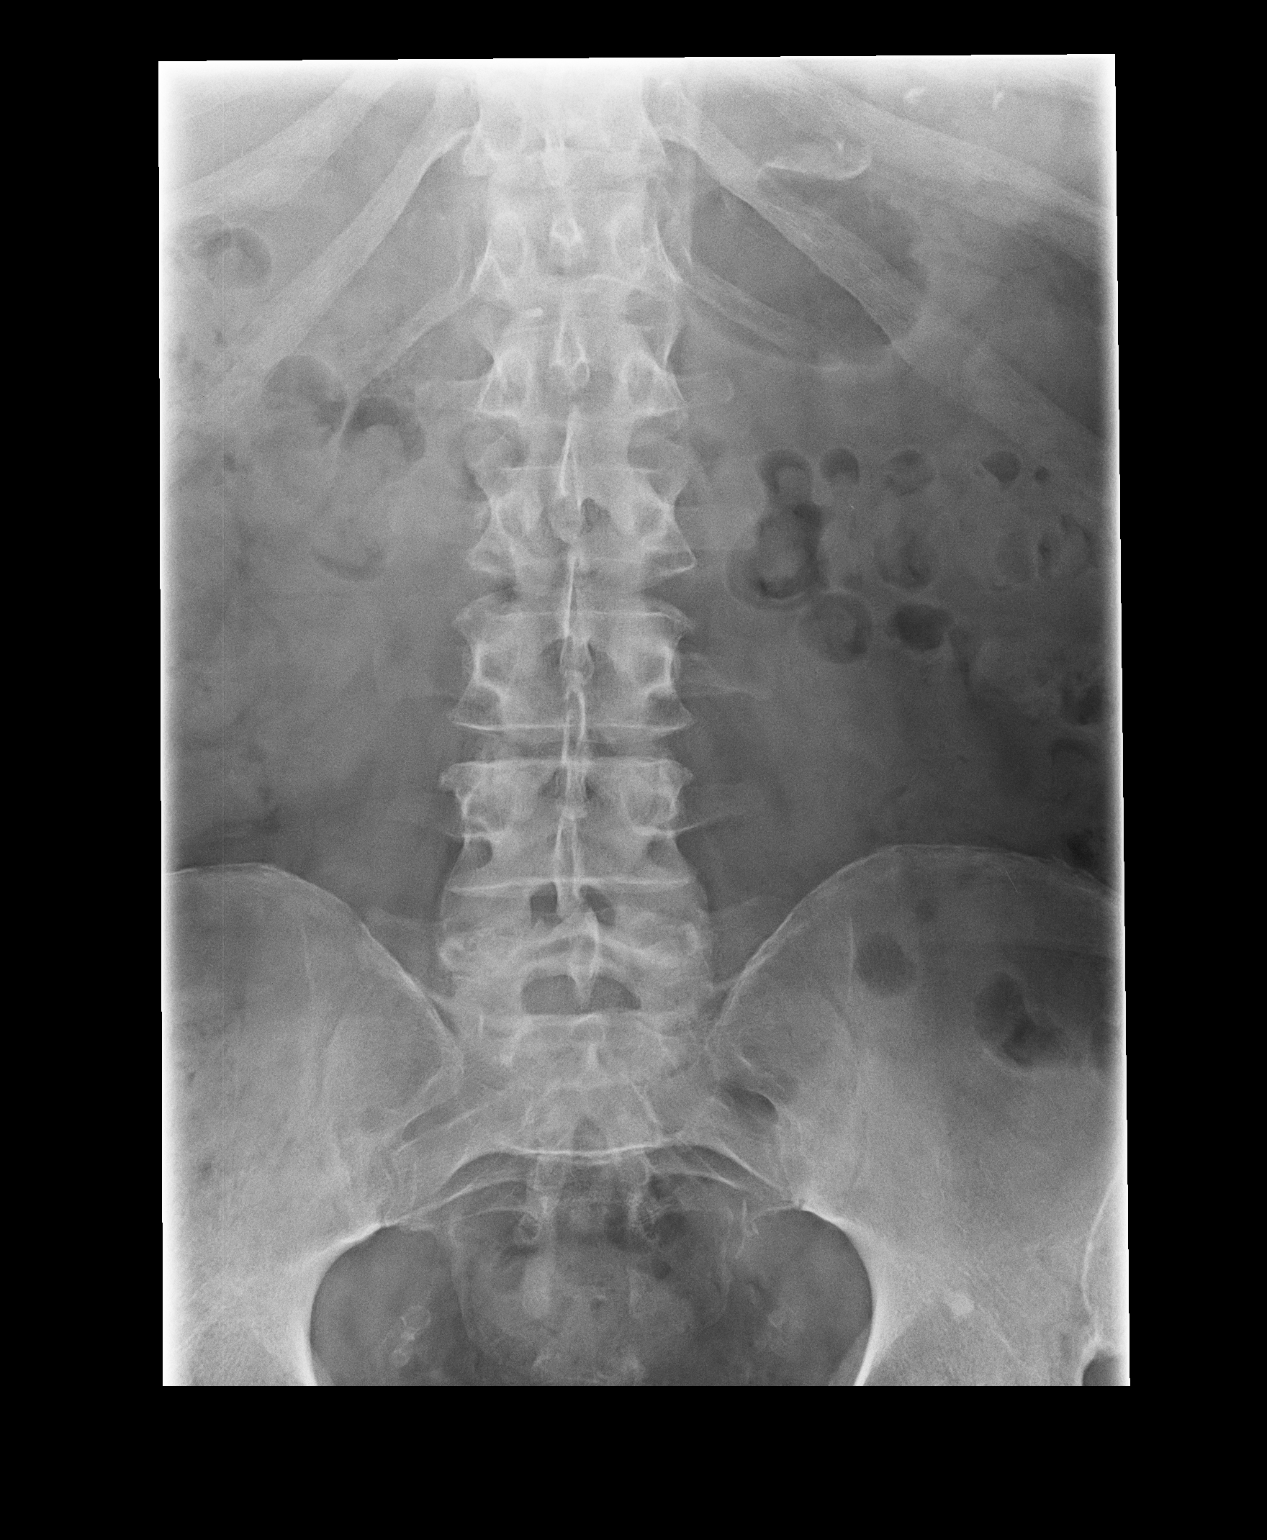

[1 of 1 positions shown; findings below may reference images not displayed]

EXAM

Complete Lumbar Spine Series

INDICATION

Right sided back pain
LOW BACK PAIN X 2 WEEKS, PAIN RADIATES DOWN RT LEG.

TECHNIQUE

AP, Lateral, and Oblique views were obtained

COMPARISONS

None available.

FINDINGS

The lumbar vertebral body heights demonstrate mild age indeterminate wedging of the superior L1
endplate. Otherwise the vertebral body heights are maintained with moderate multilevel degenerative
disc disease with disc height loss marginal osteophyte formation and moderate to severe facet
arthrosis. There is spondylolysis of the inferior articular processes. There is moderate stool seen
throughout the colon.

IMPRESSION

1. Moderate multilevel degenerative changes of the lumbar spine with age indeterminate mild
superior L1 endplate deformity. If there remains persistent clinical concern follow-up with MRI of
the lumbar spine may be useful to assess for subtle edema.

Tech Notes:

LOW BACK PAIN X 2 WEEKS, PAIN RADIATES DOWN RT LEG.

## 2020-10-27 ENCOUNTER — Encounter: Admit: 2020-10-27 | Discharge: 2020-10-27 | Payer: MEDICARE

## 2020-10-27 DIAGNOSIS — I10 Essential (primary) hypertension: Secondary | ICD-10-CM

## 2020-10-27 DIAGNOSIS — E7800 Pure hypercholesterolemia, unspecified: Secondary | ICD-10-CM

## 2020-10-27 DIAGNOSIS — D6851 Activated protein C resistance: Secondary | ICD-10-CM

## 2020-11-05 ENCOUNTER — Encounter: Admit: 2020-11-05 | Discharge: 2020-11-05 | Payer: MEDICARE

## 2020-11-05 DIAGNOSIS — I10 Essential (primary) hypertension: Secondary | ICD-10-CM

## 2020-11-05 MED ORDER — OLMESARTAN-HYDROCHLOROTHIAZIDE 20-12.5 MG PO TAB
ORAL_TABLET | Freq: Every morning | ORAL | 3 refills | Status: AC
Start: 2020-11-05 — End: ?

## 2020-11-12 ENCOUNTER — Encounter: Admit: 2020-11-12 | Discharge: 2020-11-12 | Payer: MEDICARE

## 2020-11-12 NOTE — Telephone Encounter
11/12/2020 8:10 AM Alejandro Jensen requesting lab orders. Patient came to their location without an order and they do not show one received by our office for completion. Faxed order to [phone]. It did go thru.

## 2020-11-19 ENCOUNTER — Encounter: Admit: 2020-11-19 | Discharge: 2020-11-19 | Payer: MEDICARE

## 2020-11-19 DIAGNOSIS — I10 Essential (primary) hypertension: Secondary | ICD-10-CM

## 2020-11-19 DIAGNOSIS — E7800 Pure hypercholesterolemia, unspecified: Secondary | ICD-10-CM

## 2020-11-19 LAB — LIPID PROFILE
CHOLESTEROL: 112
HDL: 33 — ABNORMAL LOW (ref >=40)
TRIGLYCERIDES: 107

## 2020-11-23 ENCOUNTER — Encounter: Admit: 2020-11-23 | Discharge: 2020-11-23 | Payer: MEDICARE

## 2020-11-23 NOTE — Telephone Encounter
-----   Message from Altamease Oiler, MD sent at 11/19/2020  5:44 PM CDT -----  Looks good.  No changes.  Thanks.  ----- Message -----  From: Floy Sabina, RN  Sent: 11/19/2020   1:33 PM CDT  To: Altamease Oiler, MD    Follow up labs for your review and recommendations. Patient on crestor 10mg  daily. LDL at 58.

## 2020-11-23 NOTE — Telephone Encounter
Sent MyChart message.

## 2021-02-04 ENCOUNTER — Encounter: Admit: 2021-02-04 | Discharge: 2021-02-04 | Payer: MEDICARE

## 2021-02-04 MED ORDER — AMLODIPINE 5 MG PO TAB
ORAL_TABLET | Freq: Every day | 3 refills | Status: AC
Start: 2021-02-04 — End: ?

## 2021-05-12 ENCOUNTER — Encounter: Admit: 2021-05-12 | Discharge: 2021-05-12 | Payer: MEDICARE

## 2021-05-12 MED ORDER — DILTIAZEM HCL 180 MG PO CP24
ORAL_CAPSULE | Freq: Every day | 3 refills | 90.00000 days | Status: AC
Start: 2021-05-12 — End: ?

## 2021-07-31 ENCOUNTER — Encounter: Admit: 2021-07-31 | Discharge: 2021-07-31 | Payer: MEDICARE

## 2021-07-31 MED ORDER — ROSUVASTATIN 10 MG PO TAB
ORAL_TABLET | 3 refills
Start: 2021-07-31 — End: ?

## 2021-10-02 ENCOUNTER — Encounter: Admit: 2021-10-02 | Discharge: 2021-10-02 | Payer: MEDICARE

## 2021-10-02 DIAGNOSIS — I10 Essential (primary) hypertension: Secondary | ICD-10-CM

## 2021-10-02 MED ORDER — OLMESARTAN-HYDROCHLOROTHIAZIDE 20-12.5 MG PO TAB
ORAL_TABLET | 3 refills
Start: 2021-10-02 — End: ?

## 2021-10-12 ENCOUNTER — Encounter: Admit: 2021-10-12 | Discharge: 2021-10-12 | Payer: MEDICARE

## 2021-10-12 ENCOUNTER — Ambulatory Visit: Admit: 2021-10-12 | Discharge: 2021-10-13 | Payer: MEDICARE

## 2021-10-12 DIAGNOSIS — I10 Essential (primary) hypertension: Secondary | ICD-10-CM

## 2021-10-12 DIAGNOSIS — E7800 Pure hypercholesterolemia, unspecified: Secondary | ICD-10-CM

## 2021-10-12 DIAGNOSIS — D6851 Activated protein C resistance: Secondary | ICD-10-CM

## 2021-10-12 DIAGNOSIS — N132 Hydronephrosis with renal and ureteral calculous obstruction: Secondary | ICD-10-CM

## 2021-10-12 DIAGNOSIS — Z006 Encounter for examination for normal comparison and control in clinical research program: Secondary | ICD-10-CM

## 2021-10-12 LAB — MICROALB/CR RATIO-URINE RANDOM
MICROALBUMIN, RAN: 47 ug/mL — ABNORMAL LOW (ref 32.0–36.0)
MICROALBUMIN/CR RATIO URINE: 31 ug/mg — ABNORMAL HIGH (ref <30)
UR CREATININE, RAN: 151 mg/dL — ABNORMAL HIGH (ref 11–15)

## 2021-10-12 NOTE — Patient Instructions
Please make sure your PMD's office sends your labs to [place].

## 2021-10-13 DIAGNOSIS — N1832 Chronic kidney disease, stage 3b (HCC): Secondary | ICD-10-CM

## 2021-11-22 ENCOUNTER — Encounter: Admit: 2021-11-22 | Discharge: 2021-11-22 | Payer: MEDICARE

## 2021-11-22 DIAGNOSIS — N1832 Stage 3b chronic kidney disease (HCC): Secondary | ICD-10-CM

## 2021-11-30 ENCOUNTER — Encounter: Admit: 2021-11-30 | Discharge: 2021-11-30 | Payer: MEDICARE

## 2021-11-30 DIAGNOSIS — Z136 Encounter for screening for cardiovascular disorders: Secondary | ICD-10-CM

## 2021-11-30 DIAGNOSIS — I10 Essential (primary) hypertension: Secondary | ICD-10-CM

## 2021-11-30 DIAGNOSIS — E7800 Pure hypercholesterolemia, unspecified: Secondary | ICD-10-CM

## 2021-11-30 DIAGNOSIS — D6851 Activated protein C resistance: Secondary | ICD-10-CM

## 2021-11-30 NOTE — Progress Notes
Date of Service: 11/30/2021    Alejandro Jensen is a 71 y.o. male.       Chief Complaint: Follow-up    History of Present Illness:     I had the pleasure of seeing Alejandro Jensen in our [place] office this morning for a cardiovascular followup.   ?  As you know, Alejandro Jensen is a remarkably pleasant 71 year old gentleman with history of stage III chronic kidney disease, hypertension, dyslipidemia, and coronary artery calcification.  Alejandro Jensen has followed with Alejandro Jensen, M.D., over the years.  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  Over the years, his blood pressure and cholesterol have been well treated.  He has had some issues with chronic kidney disease for which he has followed with Alejandro Jensen, M.D., at the [facility].     Since her last visit a year ago Alejandro Jensen is really gotten along quite well from a cardiovascular standpoint.  He is not having any chest pain or shortness of breath.  No lightheadedness, dizziness, palpitations, orthopnea, paroxysmal nocturnal dyspnea.  He did see Dr. Melida Jensen little over a month ago and his hydrochlorothiazide was stopped due to his hyperuricemia and recurrent issues with nephrolithiasis.  Tells me his blood pressure has remained well controlled despite stopping the hydrochlorothiazide.  He does have some very trivial lower extremity swelling around his sock line at the end of the day, but nothing that is particularly bothersome to him.  Fortunately he is having some right flank pain and thinks he may have another kidney stone.  ?  Alejandro Jensen is married with 3 children and 4 grandchildren.  He is thinking about retiring in a stepwise fashion over the next year or two.  He is planning on taking a trip to [place] here in August for the [place] festival, apparently the world's largest.  He is also going to go to the [place] with his wife later in August as well to visit some friends.    We did obtain an ECG in the office today.  Interpretation sinus rhythm.  Heart rate 64 bpm.  First-degree atrioventricular block.  Normal axis.  QTc 410 ms.       Past Medical History:  Patient Active Problem List    Diagnosis Date Noted   ? Clinical trial participant: Alejandro Jensen  09/03/2019     2020 enrollment EMPA-KIDNEY Trial through [place].      ? Hyperuricemia 07/03/2018   ? Chronic kidney disease, stage 3 (HCC) 06/05/2018   ? Renal insufficiency 08/14/2012     Associated with hydronephrosis and angiotensin receptor blocker therapy     ? Hypothyroidism 07/31/2012     07/30/12 TSH 4.42 on 125 mcg synthroid,(Nl < 3.74) increase to 150 Alejandro Jensen.      ? Hydronephrosis with right ureteral calculus 03/21/2012     11/13 Stone removed d/t persistent pain Alejandro Jensen [facility]  Jan 2014 Creat 1.8, 07/30/12 Creat 1.6 BUN 17, K 4.3 on Losartan 100/day w/ blood pressure 152/82   1/15 OV Alejandro Jensen, most recent Creat 1.8     ? Essential hypertension      a. Valsartan better control than accupril  b. 2008 Echo EF 60% Septum 1.8 cm, post wall 1.4 indicating LVH as end organ damage from LVH  c. 2/09 Echo EF 60% Septum and Post wall 1.4 cm  D. 07/31/12 BP 152/82 on Losartan 100   E. 06/18/13 BP 110/80 MAC office on Amlodipine 5, Dilt 360,  Valsartan 160 andHCTZ 12.5 with no edema       ? Hypercholesterolemia       a. Niacin from 2006(?)    b. 04/28/06 total 169 trig 219 HDL 39 LDLd 85 Niacin 500 BID    c. 07/17/08 Total 97 trig 79 HDL 40 LDL 42 Crestor 20 niacin 500 BID  d. 02/17/15 DC niacin 500 d/t lack of outcomes benefits from Niacin.        ? Factor V Leiden (HCC)      a. Folate Rx by [facility], DC'd 3/08           Review of Systems   Constitutional: Negative.   HENT: Negative.    Eyes: Negative.    Cardiovascular: Negative.    Respiratory: Negative.    Endocrine: Negative.    Hematologic/Lymphatic: Negative.    Skin: Negative.    Musculoskeletal: Negative.    Gastrointestinal: Negative.    Genitourinary: Negative.    Neurological: Negative. Psychiatric/Behavioral: Negative.    Allergic/Immunologic: Negative.        Vitals:    11/30/21 0747   BP: 128/78   BP Source: Arm, Left Upper   Pulse: 71   SpO2: 97%   O2 Device: None (Room air)   PainSc: Zero   Weight: 96.6 kg (213 lb)   Height: 175.3 cm (5' 9)     Body mass index is 31.45 kg/m?Marland Kitchen    Physical Examination:  General Appearance: No acute distress. Fully alert and oriented.  Skin: Warm. No ulcers or xanthomas.   HEENT: Grossly unremarkable. Lips and oral mucosa without pallor or cyanosis. Moist mucous membranes.   Neck Veins: Normal jugular venous pressure. Neck veins are not distended.  Carotid Arteries: Normal carotid upstroke bilaterally. No bruits.  Chest Inspection:  Chest is normal in appearance.  Auscultation/Percussion: Normal respiratory effort. Lungs clear to auscultation bilaterally. No wheezes, rales, or rhonchi.    Cardiac Rhythm: Regular rhythm. Normal rate.  Cardiac Auscultation: Normal S1 & S2. No S3 or S4. No rub.  Murmurs: No cardiac murmurs.  Peripheral Circulation: Normal peripheral circulation.   Abdominal Aorta: No abdominal aortic bruit.  Extremities: Appropriately warm to touch. No lower extremity edema.  Abdominal Exam: Soft, non-tender. No masses, no organomegaly. Normal bowel sounds.  Neurologic Exam: Neurological assessment grossly intact.       Assessment and Plan:  1. Coronary artery calcification:  Alejandro Jensen recalls having a coronary artery calcium score in the past, which was positive.  It sounds as though this is the reason for treatment of his cholesterol.  He is not experiencing any exertional chest pain or shortness of breath.  I see no indication to repeat a coronary artery calcium score at this time, as he is on statin therapy.  He will let us know if he develops any symptoms and if he does, I would recommend obtaining a functional ischemic evaluation such as a treadmill myocardial perfusion study.  For now, we will just monitor.  2. Hypertension:  Very well controlled despite recent discontinuation of his hydrochlorothiazide.  His blood pressure in the office today is 128/78 mmHg.  He does have stage 3A chronic kidney disease.  He is currently on amlodipine, diltiazem, olmesartan.  I do note that he is on two calcium channel blockers.    Is Dr. Melida Jensen and Alejandro Jensen are managing his blood pressure closely I will not make any changes today.  3. Dyslipidemia:  Historically well controlled cholesterol.  His most recent fasting lipid panel  from October 28, 2021 demonstrated triglycerides 99, HDL 32, LDL 54.   We will plan to repeat a fasting lipid panel in 1 year.  4. Stage 3A chronic kidney disease:  He follows with Alejandro Hamper, MD.    ?  I really enjoyed seeing Troi in the office today and I appreciate the opportunity to take part in his care.  I look forward to seeing him back in 1 year.  If I can be of any assistance in the interim, please do not hesitate to reach out with questions or concerns.         Total time spent on today's office visit was 35 minutes. This includes face-to-face in person visit with patient as well as non face-to-face time including review of the electronic medical record, outside records, labs, radiologic studies, cardiovascular studies, formulation of treatment plan, after visit summary, future disposition, personal discussions, and documentation.    Current Medications (including today's revisions)  ? amLODIPine (NORVASC) 5 mg tablet TAKE 1 TABLET BY MOUTH EVERY DAY   ? Aspirin 81 mg Tab Take one tablet by mouth daily.   ? Coenzyme Q10 10 mg cap Take  by mouth.   ? dilTIAZem CD (CARDIZEM CD) 180 mg capsule TAKE TWO CAPSULES BY MOUTH EVERY DAY   ? ERGOCALCIFEROL (VITAMIN D2) (VITAMIN D PO) Take 2,000 Units by mouth daily.   ? levothyroxine (SYNTHROID) 125 mcg tablet Take one tablet by mouth daily.   ? olmesartan (BENICAR) 20 mg tablet Take one tablet by mouth daily.   ? olmesartan-hydroCHLOROthiazide (BENICAR HCT) 20-12.5 mg tablet TAKE ONE TABLET BY MOUTH EVERY MORNING   ? rosuvastatin (CRESTOR) 10 mg tablet TAKE 1 TABLET BY MOUTH EVERY DAY

## 2022-01-07 ENCOUNTER — Encounter: Admit: 2022-01-07 | Discharge: 2022-01-07 | Payer: MEDICARE

## 2022-01-07 MED ORDER — AMLODIPINE 5 MG PO TAB
ORAL_TABLET | 3 refills | Status: AC
Start: 2022-01-07 — End: ?

## 2022-01-07 MED ORDER — ROSUVASTATIN 10 MG PO TAB
ORAL_TABLET | ORAL | 1 refills | 90.00000 days | Status: AC
Start: 2022-01-07 — End: ?

## 2022-03-22 ENCOUNTER — Encounter: Admit: 2022-03-22 | Discharge: 2022-03-22 | Payer: MEDICARE

## 2022-04-12 ENCOUNTER — Encounter: Admit: 2022-04-12 | Discharge: 2022-04-12 | Payer: MEDICARE

## 2022-04-12 ENCOUNTER — Ambulatory Visit: Admit: 2022-04-12 | Discharge: 2022-04-13 | Payer: MEDICARE

## 2022-04-12 DIAGNOSIS — D6851 Activated protein C resistance: Secondary | ICD-10-CM

## 2022-04-12 DIAGNOSIS — I10 Essential (primary) hypertension: Secondary | ICD-10-CM

## 2022-04-12 DIAGNOSIS — E7800 Pure hypercholesterolemia, unspecified: Secondary | ICD-10-CM

## 2022-04-18 ENCOUNTER — Encounter: Admit: 2022-04-18 | Discharge: 2022-04-18 | Payer: MEDICARE

## 2022-04-18 MED ORDER — DILTIAZEM HCL 180 MG PO CP24
ORAL_CAPSULE | 3 refills | 90.00000 days | Status: AC
Start: 2022-04-18 — End: ?

## 2022-05-12 ENCOUNTER — Encounter: Admit: 2022-05-12 | Discharge: 2022-05-12 | Payer: MEDICARE

## 2022-05-19 ENCOUNTER — Encounter: Admit: 2022-05-19 | Discharge: 2022-05-19 | Payer: MEDICARE

## 2022-05-25 ENCOUNTER — Encounter: Admit: 2022-05-25 | Discharge: 2022-05-25 | Payer: MEDICARE

## 2022-05-26 ENCOUNTER — Encounter: Admit: 2022-05-26 | Discharge: 2022-05-26 | Payer: MEDICARE

## 2022-05-26 NOTE — Telephone Encounter
Per Dr. Anabel Bene:  Pt was seen at [facility] to get second opinion about neph care.  He was on EMPA trial and is convinced the drug worked well on him, though it is still unknown if he got study drug or placebo.    He is most anxious to start SGLT2 inhib.  His alb/cr was very low when checked in Jun 2023, so it is not clear that benefits outweigh possible risks in pts with non-DM disease and very low proteinuria.  He could start dapagliflozin 10/d if he wishes, but has to have all the potential adverse effects discussed at length.  He was in the [facility] study, so has heard it before, but still needs to hear it again.   I suspect he will do whatever [facility] says, which may be start SGLT2.  It's possible his PMD has already started. You could check back with him to see what the status is.     Spoke with pt to f/u, he states he hasn't heard from [facility] regarding SGLT2 specifically. Discussed the above with pt and pt decided to hold off on starting dapagliflozin at this time if it is not clear that benefits outweigh the possible risks in his case. Pt had no further questions or concerns by end of call.

## 2022-06-08 ENCOUNTER — Encounter: Admit: 2022-06-08 | Discharge: 2022-06-08 | Payer: MEDICARE

## 2022-07-16 ENCOUNTER — Encounter: Admit: 2022-07-16 | Discharge: 2022-07-16 | Payer: MEDICARE

## 2022-07-16 MED ORDER — ROSUVASTATIN 10 MG PO TAB
10 mg | ORAL_TABLET | Freq: Every day | ORAL | 1 refills
Start: 2022-07-16 — End: ?

## 2022-08-17 ENCOUNTER — Encounter: Admit: 2022-08-17 | Discharge: 2022-08-17 | Payer: MEDICARE

## 2022-09-04 ENCOUNTER — Encounter: Admit: 2022-09-04 | Discharge: 2022-09-04 | Payer: MEDICARE

## 2022-09-04 NOTE — Progress Notes
Request for the following medical records for purpose of continuity of care:   Has an appointment with ANO on 09/12/2022    Please send:   - Most recent OV note   - ECG strips associated to Holter (05/2022)    Please Fax to: [phone]  Cardiology Services at the [facility]   Dr. Milas Kocher  Attention:  Aundra Millet, RN    Thank you

## 2022-09-04 NOTE — Progress Notes
Request for the following medical records for purpose of continuity of care:   Has an appointment with ANO on 09/12/2022    Please send ECG strips associated with Treadmill Exercise ECG (03/2022)    Please Fax to: [phone]  Cardiology Services at the [facility]   Dr. Milas Kocher  Attention:  Aundra Millet, RN    Thank you

## 2022-09-06 ENCOUNTER — Encounter: Admit: 2022-09-06 | Discharge: 2022-09-06 | Payer: MEDICARE

## 2022-09-06 NOTE — Progress Notes
Records Request    Medical records request for continuation of care:    Alejandro, Jensen.  DOB:  1950-08-30    Patient has appointment on 09/12/2022   with  Dr. Annamarie Dawley* .      **Please fax records to Cardiovascular Medicine [facility] [phone]      Request records:STAT                      EKG Tracings/Strips from Treadmill Stress Test (November 2023)                          Thank you,      Cardiovascular Medicine  [facility]  [address]  [place], [place] [address]  Phone:  [phone]  Fax:  [phone]

## 2022-09-12 ENCOUNTER — Encounter: Admit: 2022-09-12 | Discharge: 2022-09-12 | Payer: MEDICARE

## 2022-09-12 DIAGNOSIS — E7800 Pure hypercholesterolemia, unspecified: Secondary | ICD-10-CM

## 2022-09-12 DIAGNOSIS — N189 Chronic kidney disease, unspecified: Secondary | ICD-10-CM

## 2022-09-12 DIAGNOSIS — I441 Atrioventricular block, second degree: Secondary | ICD-10-CM

## 2022-09-12 DIAGNOSIS — I159 Secondary hypertension, unspecified: Secondary | ICD-10-CM

## 2022-09-12 DIAGNOSIS — I10 Essential (primary) hypertension: Secondary | ICD-10-CM

## 2022-09-12 DIAGNOSIS — Z136 Encounter for screening for cardiovascular disorders: Secondary | ICD-10-CM

## 2022-09-12 DIAGNOSIS — D6851 Activated protein C resistance: Secondary | ICD-10-CM

## 2022-09-12 MED ORDER — AMLODIPINE 10 MG PO TAB
10 mg | ORAL_TABLET | Freq: Every day | ORAL | 3 refills | Status: AC
Start: 2022-09-12 — End: ?

## 2022-09-12 MED ORDER — DILTIAZEM HCL 180 MG PO CP24
180 mg | ORAL_CAPSULE | Freq: Every day | ORAL | 3 refills | 90.00000 days | Status: AC
Start: 2022-09-12 — End: ?

## 2022-09-12 NOTE — Progress Notes
Date of Service: 09/12/2022    Alejandro Jensen is a 72 y.o. male.       HPI          Alejandro Jensen, a 72 year old with a history of hypertension and chronic kidney disease, was referred for cardiology consultation due to an abnormal Holter monitor result showing second-degree AV block, Mobitz type 1. The patient reports no cardiac symptoms, such as chest pain, palpitations, or shortness of breath. However, he occasionally experiences lightheadedness, which may be positional. Despite these occasional episodes, he maintains good exercise tolerance and continues to work. His hypertension is managed with amlodipine, diltiazem, and olmesartan, and his cholesterol is managed with Crestor.       Physical Exam    VITALS: P- 67, BP- high, normally built gentleman, slightly increased BMI, no distress, heart and lung sounds are unremarkable, no pedal edema.      ECG: Sinus rhythm with first-degree AV block PR interval 236 ms, PAC.  Otherwise normal ECG.    Treadmill test done at [facility] he tolerated 6-1/2 minutes on Bruce protocol 7-1/2 METS with 91% maximum protected heart rate without any AV block during exercise.    2-day Holter monitor done in January reports average heart rate in the 60s with episodes of 2: 1 AV block as well as Mobitz type I second-degree AV block mostly during night.    Echocardiogram from 2020 reported structurally normal heart with mild concentric left hypertrophy LVEF 60%, no significant valvular disease.    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  Fatigue.   Hypertension.  Chronic kidney disease.  Nonobstructive coronary artery disease.  Hyperlipidemia.    Plan       Second Degree AV Block, Mobitz Type 1: Asymptomatic with episodes noted on Holter monitor, mostly during sleep. No need for pacemaker. Possible contributing factors include Diltiazem use and potential sleep apnea.  -Reduce Diltiazem from 360mg  to 180mg  daily.  If necessary this can be further titrated off by his regular physicians.  -Increase Amlodipine from 5mg  to 10mg  daily to compensate for reduced Diltiazem.  -Order home sleep study to screen for sleep apnea that may be contributory to nocturnal bradycardia and fatigue.  -No indication for pacemaker.    Hypertension: Blood pressure elevated during visit, managed with Diltiazem and Amlodipine.  -Adjust medications as noted above.  -Recheck blood pressure before patient leaves.  -Defer management to Drs. Epley, Love and Melida Quitter.    Chronic Kidney Disease: Stable with creatinine slightly increased from baseline. Managed by nephrologist.  -No changes to current management plan.    Follow-up: Patient to continue care with primary care physician, cardiologist, and nephrologist. No need for follow-up with this office.               Vitals:    09/12/22 0855   BP: (!) 159/89   BP Source: Arm, Left Upper   Pulse: 76   SpO2: 98%   O2 Device: None (Room air)   PainSc: Zero   Weight: 97.8 kg (215 lb 9.6 oz)   Height: 175.3 cm (5' 9)     Body mass index is 31.84 kg/m?Marland Kitchen     Past Medical History  Patient Active Problem List    Diagnosis Date Noted    Mobitz type 2 second degree AV block 09/04/2022     Per OV note on 03/29/2022 with Dr. Francina Ames Lara-Breitinger (Department of Cardiovascular Medicine in [place])    03/29/2022 -  Treadmill Exercise ECG:  [facility])  Negative exercise ECG, but below average exercise capacity (70-89% FAC).  05/23/2022 - Holter:  ([facility])  This is an abnormal 48 Hour Holter monitor with type I second degree AVB resulting in pauses lasting up to 3.2 seconds and episodes of 2:1AVB.  Consider eval with EP.       Clinical trial participant: [facility]  09/03/2019     2020 enrollment EMPA-KIDNEY Trial through [place].       Hyperuricemia 07/03/2018    Chronic kidney disease, stage 3 (HCC) 06/05/2018     03/29/2022 - Kidney with Renal Artery Doppler:  [facility])  Echogenic renal parenchyma with no hydronephrosis. Bilateral renal arteries patent and negative for significant stenosis where seen.  Bilateral renal cysts       Renal insufficiency 08/14/2012     Associated with hydronephrosis and angiotensin receptor blocker therapy      Hypothyroidism 07/31/2012     07/30/12 TSH 4.42 on 125 mcg synthroid,(Nl < 3.74) increase to 150 Dr. Andreas Newport.       Hydronephrosis with right ureteral calculus 03/21/2012     11/13 Stone removed d/t persistent pain [facility] Mccreedy [facility]  Jan 2014 Creat 1.8, 07/30/12 Creat 1.6 BUN 17, K 4.3 on Losartan 100/day w/ blood pressure 152/82   1/15 OV Dr. Larwance Rote, most recent Creat 1.8      Essential hypertension      a. Valsartan better control than accupril  b. 2008 Echo EF 60% Septum 1.8 cm, post wall 1.4 indicating LVH as end organ damage from LVH  c. 2/09 Echo EF 60% Septum and Post wall 1.4 cm  D. 07/31/12 BP 152/82 on Losartan 100   E. 06/18/13 BP 110/80 MAC office on Amlodipine 5, Dilt 360, Valsartan 160 andHCTZ 12.5 with no edema        Hypercholesterolemia       a. Niacin from 2006(?)    b. 04/28/06 total 169 trig 219 HDL 39 LDLd 85 Niacin 500 BID    c. 07/17/08 Total 97 trig 79 HDL 40 LDL 42 Crestor 20 niacin 500 BID  d. 02/17/15 DC niacin 500 d/t lack of outcomes benefits from Niacin.     06/07/2016 - Cardioscan:  ([facility])  The total Agatston score of 454.4 ranks Branko Gehlhausen within the Higher Risk Category for males, of similar age.  (Note: the database only contains persons between the ages of 28 and 63.)       Factor V Leiden (HCC)      a. Folate Rx by [facility], DC'd 3/08           Review of Systems   Constitutional: Negative.   HENT: Negative.     Eyes: Negative.    Cardiovascular:  Positive for dyspnea on exertion and leg swelling.   Respiratory: Negative.     Endocrine: Negative.    Hematologic/Lymphatic: Negative.    Skin: Negative.    Musculoskeletal: Negative.    Gastrointestinal: Negative.    Genitourinary: Negative.    Neurological: Negative. Psychiatric/Behavioral: Negative.     Allergic/Immunologic: Negative.        Cardiovascular Health Factors  Vitals BP Readings from Last 3 Encounters:   09/12/22 (!) 159/89   04/12/22 (P) 128/80   11/30/21 128/78     Wt Readings from Last 3 Encounters:   09/12/22 97.8 kg (215 lb 9.6 oz)   04/12/22 96 kg (211 lb 9.6 oz)   11/30/21 96.6 kg (213 lb)  BMI Readings from Last 3 Encounters:   09/12/22 31.84 kg/m?   04/12/22 31.25 kg/m?   11/30/21 31.45 kg/m?      Smoking Social History     Tobacco Use   Smoking Status Never   Smokeless Tobacco Never      Lipid Profile Cholesterol   Date Value Ref Range Status   10/28/2021 105  Final     HDL   Date Value Ref Range Status   10/28/2021 32 (L) >=40 Final     LDL   Date Value Ref Range Status   10/28/2021 54  Final     Triglycerides   Date Value Ref Range Status   10/28/2021 99  Final      Blood Sugar Hemoglobin A1C   Date Value Ref Range Status   04/08/2014 6.0  Final     Glucose   Date Value Ref Range Status   10/28/2021 123 (H) 70 - 105    08/30/2019 114 (H) 70 - 105 Final   08/30/2019 114 (H) 70 - 105 Final          I spent over 45 minutes on the date of service related to this patient's care.            Current Medications (including today's revisions)   Aspirin 81 mg Tab Take one tablet by mouth daily.    Coenzyme Q10 10 mg cap Take  by mouth daily.    ERGOCALCIFEROL (VITAMIN D2) (VITAMIN D PO) Take 2,000 Units by mouth daily.    levothyroxine (SYNTHROID) 125 mcg tablet Take one tablet by mouth daily.    olmesartan (BENICAR) 20 mg tablet Take one tablet by mouth daily.    rosuvastatin (CRESTOR) 10 mg tablet TAKE ONE TABLET BY MOUTH EVERY DAY

## 2022-10-10 ENCOUNTER — Encounter: Admit: 2022-10-10 | Discharge: 2022-10-10 | Payer: MEDICARE

## 2022-10-11 ENCOUNTER — Encounter: Admit: 2022-10-11 | Discharge: 2022-10-11 | Payer: MEDICARE

## 2022-10-11 NOTE — Telephone Encounter
Spoke with Alejandro Jensen told him that his results are not yet in the chart. He asked multiple questions about when he would receive his results. He said he sent them in via the mail on 05/20. Informed it usually takes them several weeks to get [place] the results. If you do not hear from them soon, then please call the sleep center at [phone] or [phone].

## 2022-10-11 NOTE — Telephone Encounter
ANO- called at 2:26pm about sleep study results. Call her at #[phone].  Received: Today   Call patient, Call with results

## 2022-10-18 ENCOUNTER — Encounter: Admit: 2022-10-18 | Discharge: 2022-10-18 | Payer: MEDICARE

## 2022-10-18 DIAGNOSIS — G479 Sleep disorder, unspecified: Secondary | ICD-10-CM

## 2022-10-19 ENCOUNTER — Encounter: Admit: 2022-10-19 | Discharge: 2022-10-19 | Payer: MEDICARE

## 2022-10-20 ENCOUNTER — Encounter: Admit: 2022-10-20 | Discharge: 2022-10-20 | Payer: MEDICARE

## 2022-10-20 DIAGNOSIS — G4733 Obstructive sleep apnea (adult) (pediatric): Secondary | ICD-10-CM

## 2022-10-20 NOTE — Telephone Encounter
Called pt and reviewed sleep study results and ANO recommendations.    An order has been placed for referral to Pulmonary Medicine for a consult.  They should be contacting you to schedule, but if you do not hear from them within the next couple of weeks, please call [phone].     Reviewed plan with the patient. Patient verbalized understanding and does not have any further questions or concerns. No further education requested from patient. Patient has our contact information for future needs.

## 2022-10-20 NOTE — Telephone Encounter
-----   Message from Lubertha Sayres, MD sent at 10/20/2022 11:47 AM CDT -----  He needs APAP to treat sleep apnea.  Has that been provided, else need to see sleep medicine.

## 2022-10-31 ENCOUNTER — Encounter: Admit: 2022-10-31 | Discharge: 2022-10-31 | Payer: MEDICARE

## 2022-11-08 ENCOUNTER — Encounter: Admit: 2022-11-08 | Discharge: 2022-11-08 | Payer: MEDICARE

## 2022-11-08 DIAGNOSIS — G4734 Idiopathic sleep related nonobstructive alveolar hypoventilation: Secondary | ICD-10-CM

## 2022-11-08 DIAGNOSIS — G4733 Obstructive sleep apnea (adult) (pediatric): Secondary | ICD-10-CM

## 2022-11-08 NOTE — Progress Notes
Placed phone call to pt to discuss Sleep Study results/recommendations.  Pt verbalized understanding and agreed w/ plan. Scheduled pt for CPAP s/u clinic on 12/15/22 with Dr Luthra.  CPAP order placed per recommendations of the reviewing doctor.

## 2022-11-21 ENCOUNTER — Encounter: Admit: 2022-11-21 | Discharge: 2022-11-21 | Payer: MEDICARE

## 2022-11-21 DIAGNOSIS — E7800 Pure hypercholesterolemia, unspecified: Secondary | ICD-10-CM

## 2022-11-21 LAB — LIPID PROFILE
CHOLESTEROL/HDL %: 3
CHOLESTEROL: 100
HDL: 36 — ABNORMAL LOW (ref >40)
LDL: 38
TRIGLYCERIDES: 131
VLDL: 26

## 2022-11-29 ENCOUNTER — Encounter: Admit: 2022-11-29 | Discharge: 2022-11-29 | Payer: MEDICARE

## 2022-11-29 NOTE — Telephone Encounter
Patient would like his sleep study results sent to Dr. Arlie Solomons at [facility] clinic.Reviewed plan with the patient. Patient verbalized understanding and does not have any further questions or concerns. No further education requested from patient. Patient has our contact information for future needs.

## 2022-11-29 NOTE — Telephone Encounter
-----   Message from Antionette Fairy sent at 11/29/2022 10:07 AM CDT -----  Regarding: ANO-Follow Up  York Spaniel he had OV in May,  his Nephrologist @ [facility] is wondering if we had anymore info then the OV.

## 2022-12-15 ENCOUNTER — Encounter: Admit: 2022-12-15 | Discharge: 2022-12-15 | Payer: MEDICARE

## 2022-12-15 ENCOUNTER — Ambulatory Visit: Admit: 2022-12-15 | Discharge: 2022-12-16 | Payer: MEDICARE

## 2022-12-15 DIAGNOSIS — E7800 Pure hypercholesterolemia, unspecified: Secondary | ICD-10-CM

## 2022-12-15 DIAGNOSIS — I10 Essential (primary) hypertension: Secondary | ICD-10-CM

## 2022-12-15 DIAGNOSIS — D6851 Activated protein C resistance: Secondary | ICD-10-CM

## 2022-12-15 DIAGNOSIS — G4733 Obstructive sleep apnea (adult) (pediatric): Secondary | ICD-10-CM

## 2022-12-15 NOTE — Patient Instructions
You were setup with a new CPAP today in clinic.      The DME company that set you up and will provide supplies is [employer].  Their contact number is [phone]..      We would like to see you back in 6-8 weeks for CPAP compliance follow up.      You must be seen for follow up within this time frame per insurance guidelines.      If you need to schedule or reschedule an appointment, please call [phone].     Insurance also requires that you use your CPAP for 4 or more hours at night at least 70% of the time for 30 days within the the first 90 days of treatment.     - If you need to schedule or reschedule an appointment, please call [phone].     - For any urgent issues after business hours, please call [phone] and have the on call pulmonary physician paged.    - If you have any questions regarding your appointment or any concerns, please contact my sleep care nurse at  [phone].     -Refills on medications, please have your pharmacy fax a refill authorization request form to our office at Fax) [phone].     Please allow at least 3 business days for refill requests.

## 2022-12-15 NOTE — Telephone Encounter
An encounter has been created for documentation only (often for preparation of an upcoming appointment or for follow up on orders/imaging or records received) and patient does not need contact RN and did not miss a phone call or appointment.     Pre-visit planning for upcoming appointment with Dr.Luthra    NEW PT    CPAP SET UP CLINIC  DME: s/u~[employer] 6-20     Sleep testing dated: 09/24/22 ordered by:   Annamarie Dawley, MD [facility]     Referred to sleep studies for   Z13.6 (ICD-10-CM) - Screening for heart disease   I15.9 (ICD-10-CM) - Secondary hypertension   I44.1 (ICD-10-CM) - Mobitz type 2 second degree AV block   N18.9 (ICD-10-CM) - Chronic kidney disease, unspecified CKD stage

## 2022-12-19 ENCOUNTER — Encounter: Admit: 2022-12-19 | Discharge: 2022-12-19 | Payer: MEDICARE

## 2022-12-19 NOTE — Telephone Encounter
Received request from DME for provider to sign form necessary for billing. CMN for CPAP supplies. Form completed and given to provider for signature.    Form will be faxed to [employer]

## 2022-12-20 ENCOUNTER — Ambulatory Visit: Admit: 2022-12-20 | Discharge: 2022-12-21 | Payer: MEDICARE

## 2022-12-20 ENCOUNTER — Encounter: Admit: 2022-12-20 | Discharge: 2022-12-20 | Payer: MEDICARE

## 2022-12-20 DIAGNOSIS — I10 Essential (primary) hypertension: Secondary | ICD-10-CM

## 2022-12-20 DIAGNOSIS — I441 Atrioventricular block, second degree: Secondary | ICD-10-CM

## 2022-12-20 NOTE — Progress Notes
Date of Service: 12/20/2022    Alejandro Jensen is a 72 y.o. male.       Chief Complaint: Follow-up    History of Present Illness:     I had the pleasure of seeing Alejandro Jensen in our [place] office this morning for a cardiovascular followup.      As you know, Alejandro Jensen is a remarkably pleasant 72 year old gentleman with history of stage III chronic kidney disease, hypertension, dyslipidemia, and coronary artery calcification.  Alejandro Jensen has followed with Bland Span, M.D., over the years.  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  Over the years, his blood pressure and cholesterol have been well treated.  He has had some issues with chronic kidney disease for which he has followed with Baron Hamper, M.D., at the [facility].     Alejandro Jensen is back in the office today for follow-up.  It has been about a year since her last visit.  Due to his chronic kidney disease Dr. Andreas Newport recommended he be evaluated at the [facility] in [place], [place].  He had pretty extensive workup up there which I think was overall unrevealing.  He did visit with a cardiologist, Alejandro Jensen who had him undergo a treadmill stress test.  He had some Mobitz 1 heart block at rest that resolved with exertion.  A cardiac monitor was recommended which confirmed similar findings, particularly at night while sleeping.  He saw Alejandro Jensen of our EP department here in consultation back in May.  His diltiazem was reduced to 180 mg daily.  Amlodipine increased to 10 mg daily.  Sleep study was completed which demonstrated obstructive sleep apnea.  He has now on CPAP which he is working on tolerating.     Alejandro Jensen is married with 3 children and 4 grandchildren.  He is thinking about retiring in a stepwise fashion over the next year.  He is leaving in 2 days for a 10-day trip to [place] in [place] with his wife and some other couples.  He is excited to get back.  Last year he was in [place] for the [place] which is apparently the largest in the world.         Past Medical History:  Patient Active Problem List    Diagnosis Date Noted    Mobitz type 2 second degree AV block 09/04/2022     Per OV note on 03/29/2022 with Dr. Francina Ames Jensen (Department of Cardiovascular Medicine in [place])    03/29/2022 - Treadmill Exercise ECG:  [facility])  Negative exercise ECG, but below average exercise capacity (70-89% FAC).  05/23/2022 - Holter:  (Alejandro Jensen)  This is an abnormal 48 Hour Holter monitor with type I second degree AVB resulting in pauses lasting up to 3.2 seconds and episodes of 2:1AVB.  Consider eval with EP.       Clinical trial participant: [facility]  09/03/2019     2020 enrollment EMPA-KIDNEY Trial through [facility].       Hyperuricemia 07/03/2018    Chronic kidney disease, stage 3 (HCC) 06/05/2018     03/29/2022 - Kidney with Renal Artery Doppler:  [facility])  Echogenic renal parenchyma with no hydronephrosis.  Bilateral renal arteries patent and negative for significant stenosis where seen.  Bilateral renal cysts       Renal insufficiency 08/14/2012     Associated with hydronephrosis and angiotensin receptor blocker therapy      Hypothyroidism 07/31/2012  07/30/12 TSH 4.42 on 125 mcg synthroid,(Nl < 3.74) increase to 150 Dr. Andreas Newport.       Hydronephrosis with right ureteral calculus 03/21/2012     11/13 Stone removed d/t persistent pain Alejandro Jensen [facility]  Jan 2014 Creat 1.8, 07/30/12 Creat 1.6 BUN 17, K 4.3 on Losartan 100/day w/ blood pressure 152/82   1/15 OV Dr. Larwance Rote, most recent Creat 1.8      Essential hypertension      a. Valsartan better control than accupril  b. 2008 Echo EF 60% Septum 1.8 cm, post wall 1.4 indicating LVH as end organ damage from LVH  c. 2/09 Echo EF 60% Septum and Post wall 1.4 cm  D. 07/31/12 BP 152/82 on Losartan 100   E. 06/18/13 BP 110/80 MAC office on Amlodipine 5, Dilt 360, Valsartan 160 andHCTZ 12.5 with no edema Hypercholesterolemia       a. Niacin from 2006(?)    b. 04/28/06 total 169 trig 219 HDL 39 LDLd 85 Niacin 500 BID    c. 07/17/08 Total 97 trig 79 HDL 40 LDL 42 Crestor 20 niacin 500 BID  d. 02/17/15 DC niacin 500 d/t lack of outcomes benefits from Niacin.     06/07/2016 - Cardioscan:  ([facility])  The total Agatston score of 454.4 ranks Alejandro Jensen within the Higher Risk Category for males, of similar age.  (Note: the database only contains persons between the ages of 56 and 45.)       Factor V Leiden (HCC)      a. Folate Rx by [facility], DC'd 3/08             Review of Systems   Constitutional: Negative.   HENT: Negative.     Eyes: Negative.    Cardiovascular: Negative.    Respiratory: Negative.     Endocrine: Negative.    Hematologic/Lymphatic: Negative.    Skin: Negative.    Musculoskeletal: Negative.    Gastrointestinal: Negative.    Genitourinary: Negative.    Neurological: Negative.    Psychiatric/Behavioral: Negative.     Allergic/Immunologic: Negative.    All other systems reviewed and are negative.      Vitals:    12/20/22 0755 12/20/22 0756   O2 Device:  None (Room air)   PainSc: Zero    Height: 175.3 cm (5' 9)      Body mass index is 31.31 kg/m?Marland Kitchen    Physical Examination:  General Appearance: No acute distress. Fully alert and oriented.  Skin: Warm. No ulcers or xanthomas.   HEENT: Grossly unremarkable. Lips and oral mucosa without pallor or cyanosis. Moist mucous membranes.   Neck Veins: Normal jugular venous pressure. Neck veins are not distended.  Carotid Arteries: Normal carotid upstroke bilaterally. No bruits.  Chest Inspection: Chest is normal in appearance.  Auscultation/Percussion: Normal respiratory effort. Lungs clear to auscultation bilaterally. No wheezes, rales, or rhonchi.    Cardiac Rhythm: Regular rhythm. Normal rate.  Cardiac Auscultation: Normal S1 & S2. No S3 or S4. No rub.  Murmurs: No cardiac murmurs.  Peripheral Circulation: Normal peripheral circulation. Abdominal Aorta: No abdominal aortic bruit.  Extremities: Appropriately warm to touch. No lower extremity edema.  Abdominal Exam: Soft, non-tender. No masses, no organomegaly. Normal bowel sounds.  Neurologic Exam: Neurological assessment grossly intact.       Assessment and Plan:  Coronary artery calcification: He had a coronary artery calcium score in 2018 with a total score of 457.  Recent treadmill stress test without evidence of  myocardial ischemia.  He is not having exertional chest discomfort or shortness of breath.  Risk factor modification as outlined below.  Hypertension: He reports good control at home despite recent changes.  Will continue current regimen.  I have asked him to keep a close eye on his blood pressure at home given his renal disease.  He will let us know if he is consistently having systolic readings greater than 130 mmHg.  Dyslipidemia:  Historically well controlled cholesterol.  His most recent fasting lipid panel from November 21, 2022.  LDL at that time was 38.  Continue current regimen.  We will plan to repeat a fasting lipid panel in 1 year.  Stage 3A chronic kidney disease:  He follows with Baron Hamper, MD.       I really enjoyed seeing Khaalid in the office today and I appreciate the opportunity to take part in his care.  I look forward to seeing him back in 1 year.  If I can be of any assistance in the interim, please do not hesitate to reach out with questions or concerns.         Total time spent on today's office visit was 35 minutes. This includes face-to-face in person visit with patient as well as non face-to-face time including review of the electronic medical record, outside records, labs, radiologic studies, cardiovascular studies, formulation of treatment plan, after visit summary, future disposition, personal discussions, and documentation.    Current Medications (including today's revisions)   amLODIPine (NORVASC) 10 mg tablet Take one tablet by mouth daily.    Aspirin 81 mg Tab Take one tablet by mouth daily.    Coenzyme Q10 10 mg cap Take  by mouth daily.    dilTIAZem CD (CARDIZEM CD) 180 mg capsule Take one capsule by mouth daily.    ERGOCALCIFEROL (VITAMIN D2) (VITAMIN D PO) Take 2,000 Units by mouth daily.    levothyroxine (SYNTHROID) 125 mcg tablet Take one tablet by mouth daily.    olmesartan (BENICAR) 20 mg tablet Take one tablet by mouth daily.    rosuvastatin (CRESTOR) 10 mg tablet TAKE ONE TABLET BY MOUTH EVERY DAY

## 2022-12-23 ENCOUNTER — Encounter: Admit: 2022-12-23 | Discharge: 2022-12-23 | Payer: MEDICARE

## 2022-12-23 NOTE — Telephone Encounter
Pt set up with cPAP @ 6-20cm H2O on 12/15/22.  DME: [facility]  Insurance: [employer]  If the patient has to meet compliance, their compliance window will be from 01/15/23 to 03/15/23.     Confirmed pt registered online in [facility]    Pt scheduled for follow up 03/08/23. with Herma Ard.    Order Completed

## 2022-12-26 ENCOUNTER — Encounter: Admit: 2022-12-26 | Discharge: 2022-12-26 | Payer: MEDICARE

## 2023-02-14 ENCOUNTER — Encounter: Admit: 2023-02-14 | Discharge: 2023-02-14 | Payer: MEDICARE

## 2023-02-27 ENCOUNTER — Encounter: Admit: 2023-02-27 | Discharge: 2023-02-27 | Payer: MEDICARE

## 2023-02-27 NOTE — Telephone Encounter
Pt lvm requesting to cancel upcoming sleep appt d/t his CPAP compliance.  He reports CPAP intolerance.  CB made. Pt instructed to keep this upcoming sleep appt to discuss tx alternatives.  He agrees, and denies addtl concerns/requests at  this time.

## 2023-03-08 ENCOUNTER — Encounter: Admit: 2023-03-08 | Discharge: 2023-03-08 | Payer: MEDICARE

## 2023-03-08 ENCOUNTER — Ambulatory Visit: Admit: 2023-03-08 | Discharge: 2023-03-09 | Payer: MEDICARE

## 2023-03-08 DIAGNOSIS — G4733 Obstructive sleep apnea (adult) (pediatric): Secondary | ICD-10-CM

## 2023-03-10 ENCOUNTER — Encounter: Admit: 2023-03-10 | Discharge: 2023-03-10 | Payer: MEDICARE

## 2023-03-10 NOTE — Telephone Encounter
The patient's PAP compliance f/u office visit notes from 03/08/23 were faxed to their DME, [facility].

## 2023-03-13 ENCOUNTER — Encounter: Admit: 2023-03-13 | Discharge: 2023-03-13 | Payer: MEDICARE

## 2023-03-16 ENCOUNTER — Encounter: Admit: 2023-03-16 | Discharge: 2023-03-16 | Payer: MEDICARE

## 2023-04-11 ENCOUNTER — Encounter: Admit: 2023-04-11 | Discharge: 2023-04-11 | Payer: MEDICARE

## 2023-04-11 ENCOUNTER — Ambulatory Visit: Admit: 2023-04-11 | Discharge: 2023-04-11 | Payer: MEDICARE

## 2023-04-11 MED ORDER — OLMESARTAN 20 MG PO TAB
30 mg | ORAL_TABLET | Freq: Every day | ORAL | 0 refills | 90.00000 days | Status: AC
Start: 2023-04-11 — End: ?

## 2023-04-11 NOTE — Progress Notes
Date of Service: 04/11/2023    Subjective:             Alejandro Jensen is a 72 y.o. male with CKD. Last seen by me 04/12/22.     History of Present Illness  No hosp, sig illness since I last saw pt.     Is followed closely by neph at [facility].      Occas ankle edema, no change x yrs    Checks home BPs every other week--130-140/75-80    Sleep MD increased amlod, decreased dilt about 6 mo ago.  Thiazide diuretic stopped many mo ago by PCP    Was on EMPA trial at [facility] in past.          14 pt ros neg except as noted.  To have sleep study Dec 2024      Objective:         amLODIPine (NORVASC) 10 mg tablet Take one tablet by mouth daily.    Aspirin 81 mg Tab Take one tablet by mouth daily.    Coenzyme Q10 10 mg cap Take  by mouth daily.    dilTIAZem CD (CARDIZEM CD) 180 mg capsule Take one capsule by mouth daily.    ERGOCALCIFEROL (VITAMIN D2) (VITAMIN D PO) Take 2,000 Units by mouth daily.    levothyroxine (SYNTHROID) 125 mcg tablet Take one tablet by mouth daily.    olmesartan (BENICAR) 20 mg tablet Take one tablet by mouth daily.    rosuvastatin (CRESTOR) 10 mg tablet TAKE ONE TABLET BY MOUTH EVERY DAY     Vitals:    04/11/23 0822   BP: (P) 124/74   BP Source: (P) Arm, Left Upper   PainSc: Zero   Weight: 97.8 kg (215 lb 11.2 oz)   Height: 175.3 cm (5' 9)     Body mass index is 31.85 kg/m?Marland Kitchen     Physical Exam  Vitals and nursing note reviewed.   Constitutional:       General: He is not in acute distress.     Appearance: Normal appearance. He is obese. He is not ill-appearing, toxic-appearing or diaphoretic.   HENT:      Head: Normocephalic and atraumatic.      Nose: No congestion or rhinorrhea.      Mouth/Throat:      Mouth: Mucous membranes are moist.   Eyes:      General: No scleral icterus.        Right eye: No discharge.         Left eye: No discharge.      Conjunctiva/sclera: Conjunctivae normal.   Cardiovascular:      Rate and Rhythm: Normal rate and regular rhythm.      Heart sounds: Normal heart sounds. No murmur heard.     No friction rub. No gallop.   Pulmonary:      Effort: Pulmonary effort is normal. No respiratory distress.      Breath sounds: Normal breath sounds. No stridor. No wheezing, rhonchi or rales.   Abdominal:      General: Bowel sounds are normal. There is no distension.      Palpations: Abdomen is soft. There is no mass.      Tenderness: There is no abdominal tenderness. There is no guarding or rebound.   Musculoskeletal:         General: No swelling, tenderness, deformity or signs of injury.      Cervical back: Neck supple. No rigidity or tenderness.  Right lower leg: No edema.      Left lower leg: No edema.   Lymphadenopathy:      Cervical: No cervical adenopathy.   Skin:     General: Skin is warm and dry.      Coloration: Skin is not jaundiced or pale.      Findings: No bruising, erythema, lesion or rash.   Neurological:      General: No focal deficit present.      Mental Status: He is alert. Mental status is at baseline.   Psychiatric:         Mood and Affect: Mood normal.         Behavior: Behavior normal.         Thought Content: Thought content normal.         Judgment: Judgment normal.       03/17/23  Cr-2.0  eGFR-34.4  UA->1.030; 2+ prot     Assessment and Plan:  CKD3--pt has had elevated Cr for many yrs. Baseline Cr appears to be high 1.0s/low 2.0s.  Recent Cr at baseline. Cardiol note of 2008 describes RUS that did not show evidence of RAS, though these results not available to me. There is also mention in cardiol clinic notes that Cr did not improve sig even when ARB held. I suspect his CKD is d/t HTN. His sediment was bland in Jan 2020; no bld, prot on dipstick. 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   Pt has been eval at [facility]. Prot/cr-0.7 there, higher than he has had here. Alb/cr-31.5 on 10/12/21.  I spoke with empa study nurse. Not able to know if pt was receiving study drug (SGLT2 inhib) or not. Apparently pt did well during the trial, no sig AE.  It is not clear that benefits of SGLT2 inhib outweigh the risks in pts with alb/cr this low. Pt seemed most anxious to be treated with such an agent.  He believes he received study drug, and that this slowed progression of his CKD.                --importance of good control of BP, BG, attaining/maintaining healthy wt, avoidance of nephrotoxins d/w pt at length               --will increase ARB, aiming for max dose of 40/d--see below   --pt preferred to have f/u done by PCP  --consider starting dapagliflozin 10 mg/d with careful attention paid to risks, possible adverse events, etc.--once optimized on ARB          HTN--suspect essential. BP differs b/w arms in past. It will be important to further document if this occurs chronically. Will need to adjust meds based on higher values, if this finding continues. May need vasc surg eval if he cont to exhibit this discrepancy. BP appears to be reasonably well-controlled on current meds. I favor use of RAAS blockade in this pt.                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 Diuretic DC'd in past yr. I do not have access to previous values, though this  was undoubtedly checked in past when he had kidney stone.                --follow     Nephrolithiasis--none recently.                --get outside labs, notes     leiden--pt states he was eval by heme here many yrs ago, was not started on anticoag. Pt denies hx thromboemb disease. Was tested b/c mother was dx'd with it.      Possible dx/px/tx options d/w pt at length; all questions answered. RTC 6-12 mo  I left a message for Dr. Andreas Newport.     Total time today 50 in the following activities:   Preparing to see the patient  Obtaining and/or reviewing separately obtained history  Performing a medically appropriate examination and/or evaluation  Counseling and educating the patient/family/caregiver  Ordering medications, tests, or procedures  Referring and communication with other health care professionals (when not separately reported)   Documenting clinical information in the electronic or other health record     Pt instructions:  See if you had urine protein/creatinine and/or albumin/creatinine done prior to telehealth visit with [facility].  Call my nurse Dahlia Client with that information [phone].     Increase olmesartan to 30 mg/d.  Follow your BP at home daily, record.  Have your BP checked as well as labs done (to check K, Cr) 7-10 days after increasing the dose.  If you are tolerating it, ie BP not too low, K, Cr not too high, will increase to full dose 40/d and repeat this process.      The plan is to maximize olmesartan.  Once that is done, and if still with sig proteinuria, will start empagliflozin or similar.     Make sure I get lab results and BP readings.

## 2023-04-12 ENCOUNTER — Encounter: Admit: 2023-04-12 | Discharge: 2023-04-12 | Payer: MEDICARE

## 2023-04-12 DIAGNOSIS — N1832 Chronic kidney disease, stage 3b (HCC): Secondary | ICD-10-CM

## 2023-04-12 NOTE — Progress Notes
Prior auth submitted via CoverMyMeds for Olmesartan due to it being a quantity limit exception needed.

## 2023-05-11 ENCOUNTER — Encounter: Admit: 2023-05-11 | Discharge: 2023-05-11 | Payer: MEDICARE

## 2023-05-11 MED ORDER — OLMESARTAN 20 MG PO TAB
30 mg | ORAL_TABLET | Freq: Every day | ORAL | 0 refills | 90.00000 days | Status: AC
Start: 2023-05-11 — End: ?

## 2023-05-12 ENCOUNTER — Ambulatory Visit: Admit: 2023-05-12 | Discharge: 2023-05-12 | Payer: MEDICARE

## 2023-05-18 ENCOUNTER — Encounter: Admit: 2023-05-18 | Discharge: 2023-05-18 | Payer: MEDICARE

## 2023-05-18 DIAGNOSIS — G4733 Obstructive sleep apnea (adult) (pediatric): Secondary | ICD-10-CM

## 2023-05-25 ENCOUNTER — Encounter: Admit: 2023-05-25 | Discharge: 2023-05-25 | Payer: MEDICARE

## 2023-05-25 NOTE — Telephone Encounter
Rec'd call from pt. He was wondering if he was still needing to do lab work since starting the increased dose of olmesartan. Advised he is correct and due for lab. Would like this done at [facility]. Orders faxed per pt request. Pt had no additional questions/concerns by end of call.

## 2023-05-31 ENCOUNTER — Encounter: Admit: 2023-05-31 | Discharge: 2023-05-31 | Payer: MEDICARE

## 2023-05-31 NOTE — Telephone Encounter
Order for CPAP 8cm H2O placed per recommendations.   DME: [facility]  Will continue to follow up.

## 2023-06-01 ENCOUNTER — Encounter: Admit: 2023-06-01 | Discharge: 2023-06-01 | Payer: MEDICARE

## 2023-06-13 ENCOUNTER — Encounter: Admit: 2023-06-13 | Discharge: 2023-06-13 | Payer: MEDICARE

## 2023-06-13 MED ORDER — OLMESARTAN 20 MG PO TAB
30 mg | ORAL_TABLET | Freq: Every day | ORAL | 6 refills | 90.00000 days | Status: AC
Start: 2023-06-13 — End: ?

## 2023-06-13 NOTE — Telephone Encounter
Needing refill for olmesartan 20mg  tab take 1.5 tabs daily. Refills sent per protocol.

## 2023-07-10 ENCOUNTER — Encounter: Admit: 2023-07-10 | Discharge: 2023-07-10 | Payer: MEDICARE

## 2023-07-10 MED ORDER — ROSUVASTATIN 10 MG PO TAB
10 mg | ORAL_TABLET | Freq: Every day | ORAL | 3 refills | 90.00000 days | Status: AC
Start: 2023-07-10 — End: ?

## 2023-07-18 ENCOUNTER — Encounter: Admit: 2023-07-18 | Discharge: 2023-07-18 | Payer: MEDICARE

## 2023-07-18 ENCOUNTER — Ambulatory Visit: Admit: 2023-07-18 | Discharge: 2023-07-18 | Payer: MEDICARE

## 2023-07-20 ENCOUNTER — Encounter: Admit: 2023-07-20 | Discharge: 2023-07-20 | Payer: MEDICARE

## 2023-07-20 MED ORDER — AMLODIPINE 10 MG PO TAB
10 mg | ORAL_TABLET | Freq: Every day | ORAL | 3 refills | Status: AC
Start: 2023-07-20 — End: ?

## 2023-08-04 ENCOUNTER — Encounter: Admit: 2023-08-04 | Discharge: 2023-08-04

## 2023-08-04 DIAGNOSIS — R0681 Apnea, not elsewhere classified: Secondary | ICD-10-CM

## 2023-08-04 NOTE — Progress Notes
Sleep study is abnormal.     Sleep study report should be uploaded in EPIC soon, please see the final report for details.

## 2023-08-15 ENCOUNTER — Encounter: Admit: 2023-08-15 | Discharge: 2023-08-15

## 2023-08-16 ENCOUNTER — Encounter: Admit: 2023-08-16 | Discharge: 2023-08-16

## 2023-08-16 NOTE — Telephone Encounter
 Order for  restart cpap compliance  placed per protocol   DME: [facility]  Will continue to follow up.

## 2023-09-06 ENCOUNTER — Encounter: Admit: 2023-09-06 | Discharge: 2023-09-06 | Payer: MEDICARE

## 2023-09-11 ENCOUNTER — Encounter: Admit: 2023-09-11 | Discharge: 2023-09-11 | Payer: MEDICARE

## 2023-09-11 ENCOUNTER — Ambulatory Visit: Admit: 2023-09-11 | Discharge: 2023-09-12 | Payer: MEDICARE

## 2023-09-13 ENCOUNTER — Encounter: Admit: 2023-09-13 | Discharge: 2023-09-13 | Payer: MEDICARE

## 2023-09-15 ENCOUNTER — Encounter: Admit: 2023-09-15 | Discharge: 2023-09-15 | Payer: MEDICARE

## 2023-09-15 NOTE — Telephone Encounter
 Order for AutoPAP 5-15cm H2O placed as discussed per office note on 09/11/23    DME: [facility]. Phone: [phone] Fax: [phone]  Will continue to follow up.

## 2023-09-28 ENCOUNTER — Encounter: Admit: 2023-09-28 | Discharge: 2023-09-28 | Payer: MEDICARE

## 2023-09-28 NOTE — Telephone Encounter
 Call received from Vivian with [facility] about status of cmn. They have faxed it several times and have not received it back. Chart review shows we have not received any cmns for patient. Alternative fax number given

## 2023-09-28 NOTE — Telephone Encounter
 Called [facility] to check status of fax. We have not received it yet. They are unable to email it but they will re-fax    Emailed John with [facility]. He should be able to email it to this MA to be signed by the provider

## 2023-10-03 ENCOUNTER — Encounter: Admit: 2023-10-03 | Discharge: 2023-10-03 | Payer: MEDICARE

## 2023-10-03 NOTE — Telephone Encounter
 Received request from DME for provider to sign form necessary for billing. CMN for CPAP & supplies. Form completed and given to provider for signature.    Form will be faxed to  [facility] .

## 2023-10-31 ENCOUNTER — Ambulatory Visit: Admit: 2023-10-31 | Discharge: 2023-11-01 | Payer: MEDICARE

## 2023-10-31 ENCOUNTER — Encounter: Admit: 2023-10-31 | Discharge: 2023-10-31 | Payer: MEDICARE

## 2023-11-15 ENCOUNTER — Encounter: Admit: 2023-11-15 | Discharge: 2023-11-15 | Payer: MEDICARE

## 2023-11-15 MED ORDER — DILTIAZEM HCL 180 MG PO CP24
180 mg | ORAL_CAPSULE | Freq: Every day | ORAL | 3 refills | 90.00000 days | Status: AC
Start: 2023-11-15 — End: ?

## 2023-11-29 ENCOUNTER — Encounter: Admit: 2023-11-29 | Discharge: 2023-11-29 | Payer: MEDICARE

## 2023-11-30 ENCOUNTER — Encounter: Admit: 2023-11-30 | Discharge: 2023-11-30 | Payer: MEDICARE

## 2023-11-30 NOTE — Telephone Encounter
 Rec'd VM From pt had some questions about labs.  Returned pt call, noticed there were lab orders in his chart but he lives in [place]. Advised can send to [facility] if that works for him. Pt agreeable - orders faxed. Advised should be there by end of the day but can get done at anytime that is convenient for him. Reports leaving a urine sample at his appt on 6/24 with Dr. Dietrich, apologized to pt as I don't see results for a urine test so unsure what happened to sample. Advised there are orders for a urine sample so he can have done at same time as blood work. Pt verbalized understanding, no additional questions/concerns by end of call.

## 2023-12-06 LAB — BASIC METABOLIC PANEL
BLD UREA NITROGEN: 19
CALCIUM: 9.2
CHLORIDE: 100
CO2: 24
CREATININE: 1.9 — ABNORMAL HIGH
GFR ESTIMATED: 36 — ABNORMAL LOW
GLUCOSE,PANEL: 122
POTASSIUM: 4.2
SODIUM: 141

## 2023-12-06 LAB — LIPID PROFILE
CHOLESTEROL/HDL %: 3
CHOLESTEROL: 98
HDL: 37
LDL: 41
TRIGLYCERIDES: 103
VLDL: 21

## 2023-12-06 LAB — CBC
HEMATOCRIT: 44
HEMOGLOBIN: 13
RBC COUNT: 5.2
WBC COUNT: 6.7

## 2023-12-06 LAB — PHOSPHORUS: PHOSPHORUS: 3.2

## 2023-12-06 LAB — PARATHYROID HORMONE: PTH: 45

## 2023-12-08 ENCOUNTER — Encounter: Admit: 2023-12-08 | Discharge: 2023-12-08 | Payer: MEDICARE

## 2023-12-11 ENCOUNTER — Encounter: Admit: 2023-12-11 | Discharge: 2023-12-11 | Payer: MEDICARE

## 2023-12-11 DIAGNOSIS — N1832 Stage 3b chronic kidney disease (CMS-HCC): Principal | ICD-10-CM

## 2023-12-12 ENCOUNTER — Encounter: Admit: 2023-12-12 | Discharge: 2023-12-12 | Payer: MEDICARE

## 2023-12-12 NOTE — Telephone Encounter
 Reviewed pt labs with Dr. Dietrich. Labs stable. Recommend to increase olmesartan  to 40mg  QD. Message sent to pt.

## 2023-12-19 ENCOUNTER — Encounter: Admit: 2023-12-19 | Discharge: 2023-12-19 | Payer: MEDICARE

## 2023-12-19 DIAGNOSIS — E7800 Pure hypercholesterolemia, unspecified: Principal | ICD-10-CM

## 2023-12-25 ENCOUNTER — Ambulatory Visit: Admit: 2023-12-25 | Discharge: 2023-12-26 | Payer: MEDICARE

## 2023-12-25 ENCOUNTER — Encounter: Admit: 2023-12-25 | Discharge: 2023-12-25 | Payer: MEDICARE

## 2023-12-28 LAB — BASIC METABOLIC PANEL
BLD UREA NITROGEN: 21
CALCIUM: 9
CHLORIDE: 107
CO2: 25
CREATININE: 1.8 — ABNORMAL HIGH
GFR ESTIMATED: 37
GLUCOSE,PANEL: 123
POTASSIUM: 4.4
SODIUM: 141

## 2024-01-01 ENCOUNTER — Encounter: Admit: 2024-01-01 | Discharge: 2024-01-01 | Payer: MEDICARE

## 2024-01-01 DIAGNOSIS — N1832 Stage 3b chronic kidney disease (CMS-HCC): Principal | ICD-10-CM

## 2024-01-02 ENCOUNTER — Encounter: Admit: 2024-01-02 | Discharge: 2024-01-02 | Payer: MEDICARE

## 2024-01-02 NOTE — Telephone Encounter
 Order for pressure change to 8-12 cm H2O placed as discussed per office note on 12/25/23   DME: [facility]  Will continue to follow up.

## 2024-04-12 ENCOUNTER — Encounter: Admit: 2024-04-12 | Discharge: 2024-04-12 | Payer: MEDICARE

## 2024-04-12 DIAGNOSIS — G4733 Obstructive sleep apnea (adult) (pediatric): Principal | ICD-10-CM

## 2024-04-12 NOTE — Telephone Encounter [36]
 Called and spoke with patient. He states that he had an annual review and him and his pcp discussed his sleep apnea and to see if he could increase his pressure to get his score down. Let pt know that I would discuss with provider and get back to him on a pressure change.    Discussed with Lamarr Pan in person and per provider ok to increase pressure to 10-14 and if needed and patient tolerates can then go up more to 10-16.

## 2024-04-15 ENCOUNTER — Encounter: Admit: 2024-04-15 | Discharge: 2024-04-15 | Payer: MEDICARE

## 2024-04-15 NOTE — Telephone Encounter [36]
 Order for pressure change to 10-14 cm H2O placed per recommendations.   DME: [facility]  Will continue to follow up.

## 2024-05-07 ENCOUNTER — Ambulatory Visit: Admit: 2024-05-07 | Discharge: 2024-05-08 | Payer: MEDICARE

## 2024-05-07 ENCOUNTER — Encounter: Admit: 2024-05-07 | Discharge: 2024-05-07 | Payer: MEDICARE
